# Patient Record
Sex: Male | Born: 1955
Health system: Southern US, Community
[De-identification: ages and names within clinical notes are randomized; demographics above are authoritative.]

## PROBLEM LIST (undated history)

## (undated) DIAGNOSIS — G4730 Sleep apnea, unspecified: Secondary | ICD-10-CM

## (undated) DIAGNOSIS — I519 Heart disease, unspecified: Secondary | ICD-10-CM

## (undated) DIAGNOSIS — E119 Type 2 diabetes mellitus without complications: Secondary | ICD-10-CM

## (undated) HISTORY — DX: Heart disease, unspecified: I51.9

## (undated) HISTORY — PX: HERNIA REPAIR: SHX51

## (undated) HISTORY — PX: APPENDECTOMY: SHX54

## (undated) HISTORY — DX: Type 2 diabetes mellitus without complications: E11.9

## (undated) HISTORY — DX: Sleep apnea, unspecified: G47.30

## (undated) HISTORY — PX: PACEMAKER IMPLANT: EP1218

---

## 2020-02-18 ENCOUNTER — Ambulatory Visit (INDEPENDENT_AMBULATORY_CARE_PROVIDER_SITE_OTHER): Payer: Medicare Other | Admitting: Cardiology

## 2020-02-18 ENCOUNTER — Other Ambulatory Visit: Payer: Self-pay

## 2020-02-18 VITALS — BP 130/64 | HR 70 | Temp 98.2°F | Ht 70.0 in | Wt 305.0 lb

## 2020-02-18 DIAGNOSIS — I4891 Unspecified atrial fibrillation: Secondary | ICD-10-CM

## 2020-02-18 DIAGNOSIS — Z950 Presence of cardiac pacemaker: Secondary | ICD-10-CM | POA: Diagnosis not present

## 2020-02-18 DIAGNOSIS — E782 Mixed hyperlipidemia: Secondary | ICD-10-CM | POA: Diagnosis not present

## 2020-02-18 DIAGNOSIS — I10 Essential (primary) hypertension: Secondary | ICD-10-CM | POA: Diagnosis not present

## 2020-02-18 MED ORDER — XARELTO 20 MG PO TABS: 20.0000 mg | ORAL_TABLET | Freq: Every day | ORAL | 3 refills | Status: DC

## 2020-02-18 NOTE — Progress Notes (Signed)
Clinical Summary Jeffery Wright is a 64 y.o.male seen today as a new consult, referred by PA Mann for the following medical problems.    1. Pacemaker/Heart block - previously follwoed by Dr Pete Glatter [phone]) - Medtronic [id number] implanted May 2016. Advisa DR MRI sure scan pacemaker.  - last check about 2 months ago  - no recent symptoms   2. HTN - compliant with meds   3. Afib - reports episode of afib on device  - has been on xarelto.  - no recent palpitaions     Had 2nd covid vaccine 2 weeks ago from health dept.     PMH HTN DM2 HL Pacemaker   No Known Allergies   Current Outpatient Medications  Medication Sig Dispense Refill  . amLODipine (NORVASC) 10 MG tablet Take 10 mg by mouth daily.    Marland Kitchen atorvastatin (LIPITOR) 40 MG tablet     . finasteride (PROSCAR) 5 MG tablet Take 5 mg by mouth daily.    . Insulin Degludec (TRESIBA FLEXTOUCH [place]) Inject into the skin.    Marland Kitchen insulin lispro (HUMALOG) 100 UNIT/ML injection Inject into the skin 3 (three) times daily before meals.    Marland Kitchen JARDIANCE 25 MG TABS tablet Take 25 mg by mouth daily.    Marland Kitchen levothyroxine (SYNTHROID) 125 MCG tablet     . oxybutynin (DITROPAN) 5 MG tablet Take 5 mg by mouth 3 (three) times daily.    Marland Kitchen spironolactone (ALDACTONE) 25 MG tablet Take 25 mg by mouth daily.    Carlena Hurl 20 MG TABS tablet Take 20 mg by mouth daily.     No current facility-administered medications for this visit.        No Known Allergies    No family history on file.   Social History Jeffery Wright has no history on file for tobacco. Jeffery Wright has no history on file for alcohol.   Review of Systems CONSTITUTIONAL: No weight loss, fever, chills, weakness or fatigue.  HEENT: Eyes: No visual loss, blurred vision, double vision or yellow sclerae.No hearing loss, sneezing, congestion, runny nose or sore throat.  SKIN: No rash or itching.  CARDIOVASCULAR: per hpi RESPIRATORY: No shortness of  breath, cough or sputum.  GASTROINTESTINAL: No anorexia, nausea, vomiting or diarrhea. No abdominal pain or blood.  GENITOURINARY: No burning on urination, no polyuria NEUROLOGICAL: No headache, dizziness, syncope, paralysis, ataxia, numbness or tingling in the extremities. No change in bowel or bladder control.  MUSCULOSKELETAL: No muscle, back pain, joint pain or stiffness.  LYMPHATICS: No enlarged nodes. No history of splenectomy.  PSYCHIATRIC: No history of depression or anxiety.  ENDOCRINOLOGIC: No reports of sweating, cold or heat intolerance. No polyuria or polydipsia.  Marland Kitchen   Physical Examination Vitals:   02/18/20 0852  BP: 130/64  Pulse: 70  Temp: 98.2 F (36.8 C)  SpO2: 95%   Filed Weights   02/18/20 0852  Weight: (!) 305 lb (138.3 kg)    Gen: resting comfortably, no acute distress HEENT: no scleral icterus, pupils equal round and reactive, no palptable cervical adenopathy,  CV: RRR, 2/6 systolic murmur rusb, no jvd Resp: Clear to auscultation bilaterally GI: abdomen is soft, non-tender, non-distended, normal bowel sounds, no hepatosplenomegaly MSK: extremities are warm, no edema.  Skin: warm, no rash Neuro:  no focal deficits Psych: appropriate affect      Assessment and Plan  1. Pacemaker/Heart block - new to [place], pacemaker placed in [place] medtronic device - will have him establish  in device clinic - request records from prior cardiologist  2. HTN - at goal, continue current meds  3. Afib - no symptoms, continue xarelto - has not been on av nodal agent, f/u device check  4. Hyperlipidemia - continue statin, request pcp labs      Arnoldo Lenis, M.D.

## 2020-02-18 NOTE — Patient Instructions (Signed)
Medication Instructions:  Your physician recommends that you continue on your current medications as directed. Please refer to the Current Medication list given to you today.  *If you need a refill on your cardiac medications before your next appointment, please call your pharmacy*   Lab Work: None today If you have labs (blood work) drawn today and your tests are completely normal, you will receive your results only by: Marland Kitchen MyChart Message (if you have MyChart) OR . A paper copy in the mail If you have any lab test that is abnormal or we need to change your treatment, we will call you to review the results.   Testing/Procedures: None today   Follow-Up: At [facility], you and your health needs are our priority.  As part of our continuing mission to provide you with exceptional heart care, we have created designated Provider Care Teams.  These Care Teams include your primary Cardiologist (physician) and Advanced Practice Providers (APPs -  Physician Assistants and Nurse Practitioners) who all work together to provide you with the care you need, when you need it.  We recommend signing up for the patient portal called "MyChart".  Sign up information is provided on this After Visit Summary.  MyChart is used to connect with patients for Virtual Visits (Telemedicine).  Patients are able to view lab/test results, encounter notes, upcoming appointments, etc.  Non-urgent messages can be sent to your provider as well.   To learn more about what you can do with MyChart, go to [email or web address].   We  Your next appointment:   6 month(s)  The format for your next appointment:   In Person  Provider:   Dina Rich, MD   Other Instructions We will make an apt for you with Device Clinic for your Medtronic pacemaker.       Thank you for choosing [facility] !

## 2020-03-16 ENCOUNTER — Encounter: Payer: Medicare Other | Admitting: Internal Medicine

## 2020-03-30 ENCOUNTER — Ambulatory Visit (INDEPENDENT_AMBULATORY_CARE_PROVIDER_SITE_OTHER): Payer: Medicare Other | Admitting: Urology

## 2020-03-30 ENCOUNTER — Encounter: Payer: Self-pay | Admitting: Urology

## 2020-03-30 ENCOUNTER — Other Ambulatory Visit: Payer: Self-pay

## 2020-03-30 VITALS — BP 123/75 | HR 67 | Temp 98.1°F | Ht 70.0 in | Wt 308.0 lb

## 2020-03-30 DIAGNOSIS — R972 Elevated prostate specific antigen [PSA]: Secondary | ICD-10-CM

## 2020-03-30 DIAGNOSIS — N3281 Overactive bladder: Secondary | ICD-10-CM

## 2020-03-30 LAB — POCT URINALYSIS DIPSTICK
Bilirubin, UA: NEGATIVE
Blood, UA: NEGATIVE
Glucose, UA: POSITIVE — AB
Ketones, UA: NEGATIVE
Leukocytes, UA: NEGATIVE
Nitrite, UA: NEGATIVE
Protein, UA: NEGATIVE
Spec Grav, UA: 1.025 (ref 1.010–1.025)
Urobilinogen, UA: NEGATIVE E.U./dL — AB
pH, UA: 5 (ref 5.0–8.0)

## 2020-03-30 LAB — BLADDER SCAN AMB NON-IMAGING: Scan Result: 130.1

## 2020-03-30 MED ORDER — MIRABEGRON ER 25 MG PO TB24
25.0000 mg | ORAL_TABLET | Freq: Every day | ORAL | 0 refills | Status: DC
Start: 2020-03-30 — End: 2020-04-26

## 2020-03-30 MED ORDER — FINASTERIDE 5 MG PO TABS: 5.0000 mg | ORAL_TABLET | Freq: Every day | ORAL | 3 refills | Status: DC

## 2020-03-30 NOTE — Patient Instructions (Signed)

## 2020-03-30 NOTE — Progress Notes (Signed)
03/30/2020 11:59 AM   Jeffery Wright 10-24-1956 [id number]  Referring provider: Assunta Found, MD [address] [place],  [place] [address]  No chief complaint on file.   HPI: Mr Jeffery Wright is a 63yo here for evaluation of BPH and OAB. He is on finasteride currently for BPH. He has been on finasteride for over 3 years. [place]. Nocturia 3x up form 0.5x. He biggest complaint is urinary urgency and urge incontinence. He is currently on oxybutynin TID. He was previously on mirabegron which worked well for his OAB symptoms.  PSA 1 year ago was <0.01.    PMH: Past Medical History:  Diagnosis Date  . Diabetes mellitus without complication (HCC)   . Heart disease   . Sleep apnea     Surgical History: Past Surgical History:  Procedure Laterality Date  . APPENDECTOMY    . HERNIA REPAIR    . PACEMAKER IMPLANT      Home Medications:  Allergies as of 03/30/2020   No Known Allergies     Medication List       Accurate as of March 30, 2020 11:59 AM. If you have any questions, ask your nurse or doctor.        amLODipine 10 MG tablet Commonly known as: NORVASC Take 10 mg by mouth daily.   atorvastatin 40 MG tablet Commonly known as: LIPITOR   finasteride 5 MG tablet Commonly known as: PROSCAR Take 5 mg by mouth daily.   insulin lispro 100 UNIT/ML injection Commonly known as: HUMALOG Inject into the skin 3 (three) times daily before meals.   Jardiance 25 MG Tabs tablet Generic drug: empagliflozin Take 25 mg by mouth daily.   levothyroxine 125 MCG tablet Commonly known as: SYNTHROID   oxybutynin 5 MG tablet Commonly known as: DITROPAN Take 5 mg by mouth 3 (three) times daily.   spironolactone 25 MG tablet Commonly known as: ALDACTONE Take 25 mg by mouth daily.   Jeffery Wright Jeffery Wright Inject into the skin.   Jeffery Wright 100 UNIT/ML Wright Pen Generic drug: insulin degludec   Jeffery Wright 200 UNIT/ML [place] Pen Generic drug:  insulin degludec SMARTSIG:180 Unit(s) Topical Daily   Xarelto 20 MG Tabs tablet Generic drug: rivaroxaban Take 1 tablet (20 mg total) by mouth daily.       Allergies: No Known Allergies  Family History: Family History  Problem Relation Age of Onset  . Leukemia Father   . Stroke Mother     Social History:  reports that he has never smoked. He has never used smokeless tobacco. No history on file for alcohol and drug.  ROS: All other review of systems were reviewed and are negative except what is noted above in HPI  Physical Exam: BP 123/75   Pulse 67   Temp 98.1 F (36.7 C)   Ht 5\' 10"  (1.778 m)   Wt (!) 308 lb (139.7 kg)   BMI 44.19 kg/m   Constitutional:  Alert and oriented, No acute distress. HEENT: Jeffery Wright AT, moist mucus membranes.  Trachea midline, no masses. Cardiovascular: No clubbing, cyanosis, or edema. Respiratory: Normal respiratory effort, no increased work of breathing. GI: Abdomen is soft, nontender, nondistended, no abdominal masses GU: No CVA tenderness. Circumcised phallus. No masses/lesions on penis, testis, scrotum. Prostate 40g smooth no nodules no induration.  Lymph: No cervical or inguinal lymphadenopathy. Skin: No rashes, bruises or suspicious lesions. Neurologic: Grossly intact, no focal deficits, moving all 4 extremities. Psychiatric: Normal mood and affect.  Laboratory Data: No results  Wright for: WBC, HGB, HCT, MCV, PLT  No results Wright for: CREATININE  No results Wright for: PSA  No results Wright for: TESTOSTERONE  No results Wright for: HGBA1C  Urinalysis    Component Value Date/Time   BILIRUBINUR neg 03/30/2020 1151   PROTEINUR Negative 03/30/2020 1151   UROBILINOGEN negative (A) 03/30/2020 1151   NITRITE neg 03/30/2020 1151   LEUKOCYTESUR Negative 03/30/2020 1151    No results Wright for: LABMICR, [place], RBCUA, LABEPIT, MUCUS, BACTERIA  Pertinent Imaging:  No results Wright for this or any previous visit. No results Wright for  this or any previous visit. No results Wright for this or any previous visit. No results Wright for this or any previous visit. No results Wright for this or any previous visit. No results Wright for this or any previous visit. No results Wright for this or any previous visit. No results Wright for this or any previous visit.  Assessment & Plan:    1. BPH with nocturia -Continue finasterdie - POCT urinalysis dipstick - BLADDER SCAN AMB NON-IMAGING  2. OAB (overactive bladder) -mirabegron 25mg , stop oxybutynin -RTC 4-6 weeks    No follow-ups on file.  Jeffery Bang, MD  [facility] [place]

## 2020-03-30 NOTE — Progress Notes (Signed)
Urological Symptom Review  Patient is experiencing the following symptoms: Frequent urination Hard to postpone urination Get up at night to urinate Leakage of urine Erection problems (male only)   Review of Systems  Gastrointestinal (upper)  : Negative for upper GI symptoms  Gastrointestinal (lower) : Negative for lower GI symptoms  Constitutional : Negative for symptoms  Skin: Negative for skin symptoms  Eyes: Negative for eye symptoms  Ear/Nose/Throat : Negative for Ear/Nose/Throat symptoms  Hematologic/Lymphatic: Negative for Hematologic/Lymphatic symptoms  Cardiovascular : Negative for cardiovascular symptoms  Respiratory : Negative for respiratory symptoms  Endocrine: Excessive thirst  Musculoskeletal: Negative for musculoskeletal symptoms  Neurological: Negative for neurological symptoms  Psychologic: Negative for psychiatric symptoms

## 2020-04-23 ENCOUNTER — Other Ambulatory Visit: Payer: Self-pay | Admitting: Urology

## 2020-05-19 ENCOUNTER — Other Ambulatory Visit: Payer: Self-pay | Admitting: Urology

## 2020-06-16 ENCOUNTER — Other Ambulatory Visit: Payer: Self-pay | Admitting: Urology

## 2020-07-02 ENCOUNTER — Other Ambulatory Visit: Payer: Self-pay | Admitting: Urology

## 2020-07-19 ENCOUNTER — Other Ambulatory Visit: Payer: Self-pay | Admitting: Urology

## 2020-08-17 ENCOUNTER — Other Ambulatory Visit: Payer: Medicare Other

## 2020-08-18 ENCOUNTER — Ambulatory Visit: Payer: Medicare Other | Admitting: Cardiology

## 2020-08-23 ENCOUNTER — Telehealth: Payer: Self-pay

## 2020-08-23 MED ORDER — AMLODIPINE BESYLATE 10 MG PO TABS: 10.0000 mg | ORAL_TABLET | Freq: Every day | ORAL | 1 refills | Status: DC

## 2020-08-23 NOTE — Telephone Encounter (Signed)
Refilled as requested  

## 2020-08-23 NOTE — Telephone Encounter (Signed)
New message    *STAT* If patient is at the pharmacy, call can be transferred to refill team.   1. Which medications need to be refilled? (please list name of each medication and dose if known) amlodipine  2. Which pharmacy/location (including street and city if local pharmacy) is medication to be sent to? [facility]  3. Do they need a 30 day or 90 day supply? 90

## 2020-08-31 ENCOUNTER — Ambulatory Visit: Payer: Medicare Other | Admitting: Urology

## 2020-09-21 ENCOUNTER — Telehealth: Payer: Self-pay

## 2020-09-21 ENCOUNTER — Ambulatory Visit (INDEPENDENT_AMBULATORY_CARE_PROVIDER_SITE_OTHER): Payer: Medicare Other | Admitting: Internal Medicine

## 2020-09-21 ENCOUNTER — Encounter: Payer: Self-pay | Admitting: Internal Medicine

## 2020-09-21 ENCOUNTER — Other Ambulatory Visit: Payer: Self-pay

## 2020-09-21 VITALS — BP 132/70 | HR 70 | Ht 70.0 in | Wt 301.0 lb

## 2020-09-21 DIAGNOSIS — I442 Atrioventricular block, complete: Secondary | ICD-10-CM | POA: Diagnosis not present

## 2020-09-21 LAB — CUP PACEART INCLINIC DEVICE CHECK
Battery Remaining Longevity: 40 mo
Battery Voltage: 2.97 V
Brady Statistic AP VP Percent: 30.57 %
Brady Statistic AP VS Percent: 0 %
Brady Statistic AS VP Percent: 69.42 %
Brady Statistic AS VS Percent: 0.01 %
Brady Statistic RA Percent Paced: 30.56 %
Brady Statistic RV Percent Paced: 99.98 %
Date Time Interrogation Session: 20211020143247
Implantable Lead Implant Date: 20160525
Implantable Lead Implant Date: 20160525
Implantable Lead Location: 753859
Implantable Lead Location: 753860
Implantable Lead Model: 5076
Implantable Lead Model: 5076
Implantable Pulse Generator Implant Date: 20160525
Lead Channel Impedance Value: 399 Ohm
Lead Channel Impedance Value: 418 Ohm
Lead Channel Impedance Value: 456 Ohm
Lead Channel Impedance Value: 494 Ohm
Lead Channel Pacing Threshold Amplitude: 0.5 V
Lead Channel Pacing Threshold Amplitude: 0.75 V
Lead Channel Pacing Threshold Pulse Width: 0.4 ms
Lead Channel Pacing Threshold Pulse Width: 0.4 ms
Lead Channel Sensing Intrinsic Amplitude: 12.5 mV
Lead Channel Sensing Intrinsic Amplitude: 2.1 mV
Lead Channel Setting Pacing Amplitude: 2 V
Lead Channel Setting Pacing Amplitude: 2 V
Lead Channel Setting Pacing Pulse Width: 0.4 ms
Lead Channel Setting Sensing Sensitivity: 1.2 mV

## 2020-09-21 NOTE — Progress Notes (Signed)
HPI Jeffery Wright presents today for ongoing evaluation andmanagement of his medtronic PPM. He is a pleasant 64 yo man with CHB, s/p PPM insertion, DM, morbid obesity, and HTN. He denies chest pressure or sob. No edema. He has been compliant with his meds.  No Known Allergies   Current Outpatient Medications  Medication Sig Dispense Refill  . amLODipine (NORVASC) 10 MG tablet Take 1 tablet (10 mg total) by mouth daily. 90 tablet 1  . atorvastatin (LIPITOR) 40 MG tablet     . finasteride (PROSCAR) 5 MG tablet Take 1 tablet (5 mg total) by mouth daily. 90 tablet 3  . Insulin Degludec (TRESIBA FLEXTOUCH [place]) Inject into the skin.    Marland Kitchen insulin lispro (HUMALOG) 100 UNIT/ML injection Inject into the skin 3 (three) times daily before meals.    Marland Kitchen JARDIANCE 25 MG TABS tablet Take 25 mg by mouth daily.    Marland Kitchen levothyroxine (SYNTHROID) 125 MCG tablet     . MYRBETRIQ 25 MG TB24 tablet TAKE 1 TABLET DAILY 30 tablet 0  . oxybutynin (DITROPAN) 5 MG tablet Take 5 mg by mouth 3 (three) times daily.    Marland Kitchen spironolactone (ALDACTONE) 25 MG tablet Take 25 mg by mouth daily.    . TRESIBA FLEXTOUCH 200 UNIT/ML FlexTouch Pen SMARTSIG:180 Unit(s) Topical Daily    . XARELTO 20 MG TABS tablet Take 1 tablet (20 mg total) by mouth daily. 90 tablet 3  . TRESIBA FLEXTOUCH 100 UNIT/ML FlexTouch Pen      No current facility-administered medications for this visit.     Past Medical History:  Diagnosis Date  . Diabetes mellitus without complication (HCC)   . Heart disease   . Sleep apnea     ROS:   All systems reviewed and negative except as noted in the HPI.   Past Surgical History:  Procedure Laterality Date  . APPENDECTOMY    . HERNIA REPAIR    . PACEMAKER IMPLANT       Family History  Problem Relation Age of Onset  . Leukemia Father   . Stroke Mother      Social History   Socioeconomic History  . Marital status: Married    Spouse name: Not on file  . Number of children: 2  . Years of  education: Not on file  . Highest education level: Not on file  Occupational History  . Occupation: Research scientist (medical)  Tobacco Use  . Smoking status: Never Smoker  . Smokeless tobacco: Never Used  Substance and Sexual Activity  . Alcohol use: Not on file  . Drug use: Not on file  . Sexual activity: Not on file  Other Topics Concern  . Not on file  Social History Narrative  . Not on file   Social Determinants of Health   Financial Resource Strain:   . Difficulty of Paying Living Expenses: Not on file  Food Insecurity:   . Worried About Programme researcher, broadcasting/film/video in the Last Year: Not on file  . Ran Out of Food in the Last Year: Not on file  Transportation Needs:   . Lack of Transportation (Medical): Not on file  . Lack of Transportation (Non-Medical): Not on file  Physical Activity:   . Days of Exercise per Week: Not on file  . Minutes of Exercise per Session: Not on file  Stress:   . Feeling of Stress : Not on file  Social Connections:   . Frequency of Communication with Friends and Family: Not  on file  . Frequency of Social Gatherings with Friends and Family: Not on file  . Attends Religious Services: Not on file  . Active Member of Clubs or Organizations: Not on file  . Attends Banker Meetings: Not on file  . Marital Status: Not on file  Intimate Partner Violence:   . Fear of Current or Ex-Partner: Not on file  . Emotionally Abused: Not on file  . Physically Abused: Not on file  . Sexually Abused: Not on file     BP 132/70   Pulse 70   Ht 5\' 10"  (1.778 m)   Wt (!) 301 lb (136.5 kg)   SpO2 97%   BMI 43.19 kg/m   Physical Exam:  Well appearing but overweight,NAD HEENT: Unremarkable Neck:  No JVD, no thyromegally Lymphatics:  No adenopathy Back:  No CVA tenderness Lungs:  Clear with no wheezes HEART:  Regular rate rhythm, no murmurs, no rubs, no clicks Abd:  soft, positive bowel sounds, no organomegally, no rebound, no guarding Ext:  2 plus pulses, no  edema, no cyanosis, no clubbing Skin:  No rashes no nodules Neuro:  CN II through XII intact, motor grossly intact  DEVICE  Normal device function.  See PaceArt for details.   Assess/Plan: 1. CHB - he is asymptomatic, s/p PPM insertion. 2. PPM - he has about 3 years on his PPM battery. 3. HTN - his bp is well controlled.  4. PAF - he is maintaining NSR. He will continue xarelto.  Jarrod Bodkins,MD

## 2020-09-21 NOTE — Patient Instructions (Signed)
Medication Instructions:  Your physician recommends that you continue on your current medications as directed. Please refer to the Current Medication list given to you today.  *If you need a refill on your cardiac medications before your next appointment, please call your pharmacy*   Lab Work: NONE   If you have labs (blood work) drawn today and your tests are completely normal, you will receive your results only by: . MyChart Message (if you have MyChart) OR . A paper copy in the mail If you have any lab test that is abnormal or we need to change your treatment, we will call you to review the results.   Testing/Procedures: NONE    Follow-Up: At [facility], you and your health needs are our priority.  As part of our continuing mission to provide you with exceptional heart care, we have created designated Provider Care Teams.  These Care Teams include your primary Cardiologist (physician) and Advanced Practice Providers (APPs -  Physician Assistants and Nurse Practitioners) who all work together to provide you with the care you need, when you need it.  We recommend signing up for the patient portal called "MyChart".  Sign up information is provided on this After Visit Summary.  MyChart is used to connect with patients for Virtual Visits (Telemedicine).  Patients are able to view lab/test results, encounter notes, upcoming appointments, etc.  Non-urgent messages can be sent to your provider as well.   To learn more about what you can do with MyChart, go to [email or web address].    Your next appointment:   1 year(s)  The format for your next appointment:   In Person  Provider:   Gregg Taylor, MD   Other Instructions Thank you for choosing [facility]!    

## 2020-09-21 NOTE — Telephone Encounter (Signed)
Patient is a new patient of Dr. Lubertha Basque. Patient is agreeable to transfer monitoring here. Attempted to call office [phone]. No answer x2. Will need patient released from clinic in [place].   Patient has been requested in [place].  Patient has remote monitor box already.

## 2020-09-26 ENCOUNTER — Ambulatory Visit (INDEPENDENT_AMBULATORY_CARE_PROVIDER_SITE_OTHER): Payer: Medicare Other | Admitting: Urology

## 2020-09-26 ENCOUNTER — Encounter: Payer: Self-pay | Admitting: Urology

## 2020-09-26 ENCOUNTER — Other Ambulatory Visit: Payer: Self-pay

## 2020-09-26 VITALS — BP 133/70 | HR 79 | Temp 98.1°F | Ht 70.0 in | Wt 301.0 lb

## 2020-09-26 DIAGNOSIS — N3281 Overactive bladder: Secondary | ICD-10-CM

## 2020-09-26 DIAGNOSIS — N401 Enlarged prostate with lower urinary tract symptoms: Secondary | ICD-10-CM

## 2020-09-26 DIAGNOSIS — N138 Other obstructive and reflux uropathy: Secondary | ICD-10-CM | POA: Insufficient documentation

## 2020-09-26 DIAGNOSIS — R351 Nocturia: Secondary | ICD-10-CM | POA: Diagnosis not present

## 2020-09-26 LAB — URINALYSIS, ROUTINE W REFLEX MICROSCOPIC
Bilirubin, UA: NEGATIVE
Ketones, UA: NEGATIVE
Leukocytes,UA: NEGATIVE
Nitrite, UA: NEGATIVE
Protein,UA: NEGATIVE
RBC, UA: NEGATIVE
Specific Gravity, UA: 1.02 (ref 1.005–1.030)
Urobilinogen, Ur: 0.2 mg/dL (ref 0.2–1.0)
pH, UA: 5 (ref 5.0–7.5)

## 2020-09-26 LAB — BLADDER SCAN AMB NON-IMAGING: Scan Result: 29

## 2020-09-26 MED ORDER — FINASTERIDE 5 MG PO TABS
5.0000 mg | ORAL_TABLET | Freq: Every day | ORAL | 3 refills | Status: DC
Start: 2020-09-26 — End: 2021-11-10

## 2020-09-26 MED ORDER — AMBULATORY NON FORMULARY MEDICATION: 0.2000 mL | 5 refills | Status: DC | PRN

## 2020-09-26 NOTE — Progress Notes (Signed)
PVR=29    Urological Symptom Review  Patient is experiencing the following symptoms: Frequent urination Hard to postpone urination Get up at night to urinate Leakage of urine Erection problems (male only)   Review of Systems  Gastrointestinal (upper)  : Negative for upper GI symptoms  Gastrointestinal (lower) : Negative for lower GI symptoms  Constitutional : Negative for symptoms  Skin: Negative for skin symptoms  Eyes: Negative for eye symptoms  Ear/Nose/Throat : Negative for Ear/Nose/Throat symptoms  Hematologic/Lymphatic: Negative for Hematologic/Lymphatic symptoms  Cardiovascular : Negative for cardiovascular symptoms  Respiratory : Negative for respiratory symptoms  Endocrine: Negative for endocrine symptoms  Musculoskeletal: Negative for musculoskeletal symptoms  Neurological: Negative for neurological symptoms  Psychologic: Negative for psychiatric symptoms

## 2020-09-26 NOTE — Progress Notes (Signed)
09/26/2020 9:56 AM   Jeffery Wright 1956/04/06 [id number]  Referring provider: Shawnie Dapper, PA-C [address] [place],  [place] [address]  Followup OAB, nocturia  HPI: Jeffery Wright is a 64yo here for followup for OAB and BPH. He has a strong stream. Nocturia 1-3x. He has continued urgency, frequency and urge incontinence. He is currently on mirabegron 25mg  daily. He is on jardiance.  He has worsening ED and has failed cilais and viagra without success. He tried edex injections which did not improve his ED.    PMH: Past Medical History:  Diagnosis Date  . Diabetes mellitus without complication (HCC)   . Heart disease   . Sleep apnea     Surgical History: Past Surgical History:  Procedure Laterality Date  . APPENDECTOMY    . HERNIA REPAIR    . PACEMAKER IMPLANT      Home Medications:  Allergies as of 09/26/2020   No Known Allergies     Medication List       Accurate as of September 26, 2020  9:56 AM. If you have any questions, ask your nurse or doctor.        amLODipine 10 MG tablet Commonly known as: NORVASC Take 1 tablet (10 mg total) by mouth daily.   atorvastatin 40 MG tablet Commonly known as: LIPITOR   finasteride 5 MG tablet Commonly known as: PROSCAR Take 1 tablet (5 mg total) by mouth daily.   insulin lispro 100 UNIT/ML injection Commonly known as: HUMALOG Inject into the skin 3 (three) times daily before meals.   Jardiance 25 MG Tabs tablet Generic drug: empagliflozin Take 25 mg by mouth daily.   levothyroxine 125 MCG tablet Commonly known as: SYNTHROID   levothyroxine 137 MCG tablet Commonly known as: SYNTHROID Take 137 mcg by mouth daily.   Myrbetriq 25 MG Tb24 tablet Generic drug: mirabegron ER TAKE 1 TABLET DAILY   oxybutynin 5 MG tablet Commonly known as: DITROPAN Take 5 mg by mouth 3 (three) times daily.   spironolactone 25 MG tablet Commonly known as: ALDACTONE Take 25 mg by mouth daily.   TRESIBA  FLEXTOUCH [place] Inject into the skin. What changed: Another medication with the same name was removed. Continue taking this medication, and follow the directions you see here. Changed by: September 28, 2020, MD   Wilkie Aye FlexTouch 200 UNIT/ML FlexTouch Pen Generic drug: insulin degludec SMARTSIG:180 Unit(s) Topical Daily What changed: Another medication with the same name was removed. Continue taking this medication, and follow the directions you see here. Changed by: Evaristo Bury, MD   Xarelto 20 MG Tabs tablet Generic drug: rivaroxaban Take 1 tablet (20 mg total) by mouth daily.       Allergies: No Known Allergies  Family History: Family History  Problem Relation Age of Onset  . Leukemia Father   . Stroke Mother     Social History:  reports that he has never smoked. He has never used smokeless tobacco. No history on file for alcohol use and drug use.  ROS: All other review of systems were reviewed and are negative except what is noted above in HPI  Physical Exam: BP 133/70   Pulse 79   Temp 98.1 F (36.7 C)   Ht 5\' 10"  (1.778 m)   Wt (!) 301 lb (136.5 kg)   BMI 43.19 kg/m   Constitutional:  Alert and oriented, No acute distress. HEENT: [place] AT, moist mucus membranes.  Trachea midline, no masses. Cardiovascular: No clubbing, cyanosis, or  edema. Respiratory: Normal respiratory effort, no increased work of breathing. GI: Abdomen is soft, nontender, nondistended, no abdominal masses GU: No CVA tenderness.  Lymph: No cervical or inguinal lymphadenopathy. Skin: No rashes, bruises or suspicious lesions. Neurologic: Grossly intact, no focal deficits, moving all 4 extremities. Psychiatric: Normal mood and affect.  Laboratory Data: No results found for: WBC, HGB, HCT, MCV, PLT  No results found for: CREATININE  No results found for: PSA  No results found for: TESTOSTERONE  No results found for: HGBA1C  Urinalysis    Component Value Date/Time   BILIRUBINUR neg  03/30/2020 1151   PROTEINUR Negative 03/30/2020 1151   UROBILINOGEN negative (A) 03/30/2020 1151   NITRITE neg 03/30/2020 1151   LEUKOCYTESUR Negative 03/30/2020 1151    No results found for: LABMICR, WBCUA, RBCUA, LABEPIT, MUCUS, BACTERIA  Pertinent Imaging:  No results found for this or any previous visit.  No results found for this or any previous visit.  No results found for this or any previous visit.  No results found for this or any previous visit.  No results found for this or any previous visit.  No results found for this or any previous visit.  No results found for this or any previous visit.  No results found for this or any previous visit.   Assessment & Plan:    1. OAB (overactive bladder) -Continue mirabegron - BLADDER SCAN AMB NON-IMAGING - Urinalysis, Routine w reflex microscopic  2. BPh with Nocturia -Continue finasteride -   No follow-ups on file.  Wilkie Aye, MD  [facility] [place]

## 2020-09-26 NOTE — Patient Instructions (Signed)

## 2020-09-28 NOTE — Telephone Encounter (Signed)
Spoke to [place] practice and patient was released to this practice in [place]. 91 day transmissions scheduled in Carelink and Epic. Patient contacted and aware of scheduled remotes evrey 91 days. He will send manual transmission as scheduled.

## 2020-10-04 ENCOUNTER — Other Ambulatory Visit: Payer: Self-pay

## 2020-10-04 ENCOUNTER — Encounter: Payer: Self-pay | Admitting: Cardiology

## 2020-10-04 ENCOUNTER — Ambulatory Visit (INDEPENDENT_AMBULATORY_CARE_PROVIDER_SITE_OTHER): Payer: Medicare Other | Admitting: Cardiology

## 2020-10-04 VITALS — BP 150/90 | HR 75 | Ht 70.0 in | Wt 305.8 lb

## 2020-10-04 DIAGNOSIS — I4891 Unspecified atrial fibrillation: Secondary | ICD-10-CM | POA: Diagnosis not present

## 2020-10-04 DIAGNOSIS — Z950 Presence of cardiac pacemaker: Secondary | ICD-10-CM | POA: Diagnosis not present

## 2020-10-04 DIAGNOSIS — I10 Essential (primary) hypertension: Secondary | ICD-10-CM | POA: Diagnosis not present

## 2020-10-04 MED ORDER — AMLODIPINE BESY-BENAZEPRIL HCL 5-40 MG PO CAPS: 1.0000 | ORAL_CAPSULE | Freq: Every day | ORAL | 0 refills | Status: DC

## 2020-10-04 NOTE — Patient Instructions (Signed)
Medication Instructions:  STOP Amlodipine   START Lotrel 5-40 mg daily  *If you need a refill on your cardiac medications before your next appointment, please call your pharmacy*   Lab Work: None today If you have labs (blood work) drawn today and your tests are completely normal, you will receive your results only by: Marland Kitchen MyChart Message (if you have MyChart) OR . A paper copy in the mail If you have any lab test that is abnormal or we need to change your treatment, we will call you to review the results.   Testing/Procedures: None today   Follow-Up: At [facility], you and your health needs are our priority.  As part of our continuing mission to provide you with exceptional heart care, we have created designated Provider Care Teams.  These Care Teams include your primary Cardiologist (physician) and Advanced Practice Providers (APPs -  Physician Assistants and Nurse Practitioners) who all work together to provide you with the care you need, when you need it.  We recommend signing up for the patient portal called "MyChart".  Sign up information is provided on this After Visit Summary.  MyChart is used to connect with patients for Virtual Visits (Telemedicine).  Patients are able to view lab/test results, encounter notes, upcoming appointments, etc.  Non-urgent messages can be sent to your provider as well.   To learn more about what you can do with MyChart, go to [email or web address].    Your next appointment:   6 month(s)  The format for your next appointment:   In Person  Provider:   Dina Rich, MD   Other Instructions None      Thank you for choosing [facility] !

## 2020-10-04 NOTE — Progress Notes (Signed)
Clinical Summary Jeffery Wright is a 64 y.o.male seen today for follow up of the following medical problems.     1. Pacemaker/Heart block - previously follwoed by Dr Pete Glatter [phone]) - Medtronic [id number] implanted May 2016. Advisa DR MRI sure scan pacemaker.   - no symptoms, recent visit with EP.   2. HTN - compliant with meds  - recent swelling in legs on norvasc 10mg  - reports he had been on lotrel 5/40 prior to moving to [place] but was changed here. Has been on norvasc 10mg , since on he has had some LE edema   3. Afib  - no recent palpitations - no bleeding on xarelto.      Has had moderna covid vaccine x 2   PMH HTN DM2 HL Pacemaker    Past Medical History:  Diagnosis Date  . Diabetes mellitus without complication (HCC)   . Heart disease   . Sleep apnea      No Known Allergies   Current Outpatient Medications  Medication Sig Dispense Refill  . AMBULATORY NON FORMULARY MEDICATION 0.2 mLs by Intracavernosal route as needed. Medication Name: Trimix  PGE 6/40 Pap 30mg  Phent 1mg  5 mL 5  . amLODipine (NORVASC) 10 MG tablet Take 1 tablet (10 mg total) by mouth daily. 90 tablet 1  . atorvastatin (LIPITOR) 40 MG tablet     . finasteride (PROSCAR) 5 MG tablet Take 1 tablet (5 mg total) by mouth daily. 90 tablet 3  . Insulin Degludec (TRESIBA FLEXTOUCH [place]) Inject into the skin.    insulin lispro (HUMALOG) 100 UNIT/ML injection Inject into the skin 3 (three) times daily before meals.    JARDIANCE 25 MG TABS tablet Take 25 mg by mouth daily.    levothyroxine (SYNTHROID) 125 MCG tablet     . levothyroxine (SYNTHROID) 137 MCG tablet Take 137 mcg by mouth daily.    MYRBETRIQ 25 MG TB24 tablet TAKE 1 TABLET DAILY 30 tablet 0  . oxybutynin (DITROPAN) 5 MG tablet Take 5 mg by mouth 3 (three) times daily.    Marland Kitchen spironolactone (ALDACTONE) 25 MG tablet Take 25 mg by mouth daily.    . TRESIBA FLEXTOUCH 200 UNIT/ML FlexTouch Pen SMARTSIG:180  Unit(s) Topical Daily    . XARELTO 20 MG TABS tablet Take 1 tablet (20 mg total) by mouth daily. 90 tablet 3   No current facility-administered medications for this visit.     Past Surgical History:  Procedure Laterality Date  . APPENDECTOMY    . HERNIA REPAIR    . PACEMAKER IMPLANT       No Known Allergies    Family History  Problem Relation Age of Onset  . Leukemia Father   . Stroke Mother      Social History Mr. Hackman reports that he has never smoked. He has never used smokeless tobacco. Mr. Remo has no history on file for alcohol use.   Review of Systems CONSTITUTIONAL: No weight loss, fever, chills, weakness or fatigue.  HEENT: Eyes: No visual loss, blurred vision, double vision or yellow sclerae.No hearing loss, sneezing, congestion, runny nose or sore throat.  SKIN: No rash or itching.  CARDIOVASCULAR: per hpi RESPIRATORY: No shortness of breath, cough or sputum.  GASTROINTESTINAL: No anorexia, nausea, vomiting or diarrhea. No abdominal pain or blood.  GENITOURINARY: No burning on urination, no polyuria NEUROLOGICAL: No headache, dizziness, syncope, paralysis, ataxia, numbness or tingling in the extremities. No change in bowel or bladder control.  MUSCULOSKELETAL:  No muscle, back pain, joint pain or stiffness.  LYMPHATICS: No enlarged nodes. No history of splenectomy.  PSYCHIATRIC: No history of depression or anxiety.  ENDOCRINOLOGIC: No reports of sweating, cold or heat intolerance. No polyuria or polydipsia.  Marland Kitchen   Physical Examination Today's Vitals   10/04/20 1325  BP: (!) 150/90  Pulse: 75  SpO2: 97%  Weight: (!) 305 lb 12.8 oz (138.7 kg)  Height: 5\' 10"  (1.778 m)   Body mass index is 43.88 kg/m.  Gen: resting comfortably, no acute distress HEENT: no scleral icterus, pupils equal round and reactive, no palptable cervical adenopathy,  CV: RRR, no m/r/g, no jvd Resp: Clear to auscultation bilaterally GI: abdomen is soft, non-tender,  non-distended, normal bowel sounds, no hepatosplenomegaly MSK: extremities are warm, no edema.  Skin: warm, no rash Neuro:  no focal deficits Psych: appropriate affect    Assessment and Plan  1. Pacemaker/Heart block - no symptoms, continue to follow with EP - EKG today shows A sensed V pacing  2. HTN - some recent LE edema perhaps related to higher norvasc dose of 10mg  daily We will d/c his norvasc 10, restart his prior lotrel (amlodopine/benazepril) 5/40mg  daily.   3. Afib -no symptoms, continue current meds      , M.D.

## 2020-10-07 ENCOUNTER — Telehealth: Payer: Self-pay

## 2020-10-07 ENCOUNTER — Other Ambulatory Visit: Payer: Self-pay

## 2020-10-07 MED ORDER — MIRABEGRON ER 25 MG PO TB24: 25.0000 mg | ORAL_TABLET | Freq: Every day | ORAL | 6 refills | Status: DC

## 2020-10-07 NOTE — Telephone Encounter (Signed)
Pt called saying [facility] needed a 90 supply of Myrbetriq ordered instead of 30. New rx sent.

## 2020-10-10 ENCOUNTER — Other Ambulatory Visit: Payer: Self-pay

## 2020-10-10 ENCOUNTER — Ambulatory Visit (INDEPENDENT_AMBULATORY_CARE_PROVIDER_SITE_OTHER): Payer: Medicare Other | Admitting: Urology

## 2020-10-10 VITALS — BP 155/79 | HR 91 | Temp 98.2°F | Ht 70.0 in | Wt 305.8 lb

## 2020-10-10 DIAGNOSIS — N5201 Erectile dysfunction due to arterial insufficiency: Secondary | ICD-10-CM

## 2020-10-10 NOTE — Progress Notes (Signed)
10/10/2020 3:14 PM   Jeffery Wright 28-Oct-1956 [id number]  Referring provider: Shawnie Dapper, PA-C [address] [place],  [place] [address]  Erectile dysfunction  HPI: Jeffery Wright is a 26NO here for followup for ED. He is here for trimix teaching   PMH: Past Medical History:  Diagnosis Date  . Diabetes mellitus without complication (HCC)   . Heart disease   . Sleep apnea     Surgical History: Past Surgical History:  Procedure Laterality Date  . APPENDECTOMY    . HERNIA REPAIR    . PACEMAKER IMPLANT      Home Medications:  Allergies as of 10/10/2020   No Known Allergies     Medication List       Accurate as of October 10, 2020  3:14 PM. If you have any questions, ask your nurse or doctor.        amLODipine-benazepril 5-40 MG capsule Commonly known as: LOTREL Take 1 capsule by mouth daily.   atorvastatin 40 MG tablet Commonly known as: LIPITOR Take 40 mg by mouth daily.   finasteride 5 MG tablet Commonly known as: PROSCAR Take 1 tablet (5 mg total) by mouth daily.   Gvoke HypoPen 2-Pack 1 MG/0.2ML [place] Generic drug: Glucagon Inject into the skin.   insulin lispro 100 UNIT/ML injection Commonly known as: HUMALOG Inject into the skin 3 (three) times daily before meals.   Jardiance 25 MG Tabs tablet Generic drug: empagliflozin Take 25 mg by mouth daily.   levothyroxine 137 MCG tablet Commonly known as: SYNTHROID Take 137 mcg by mouth daily.   Myrbetriq 25 MG Tb24 tablet Generic drug: mirabegron ER TAKE 1 TABLET DAILY   mirabegron ER 25 MG Tb24 tablet Commonly known as: MYRBETRIQ Take 1 tablet (25 mg total) by mouth daily.   pregabalin 75 MG capsule Commonly known as: LYRICA Take 75 mg by mouth 2 (two) times daily.   spironolactone 25 MG tablet Commonly known as: ALDACTONE Take 25 mg by mouth daily.   TRESIBA FLEXTOUCH [place] Inject into the skin.   Xarelto 20 MG Tabs tablet Generic drug: rivaroxaban Take 1 tablet  (20 mg total) by mouth daily.       Allergies: No Known Allergies  Family History: Family History  Problem Relation Age of Onset  . Leukemia Father   . Stroke Mother     Social History:  reports that he has never smoked. He has never used smokeless tobacco. No history on file for alcohol use and drug use.  ROS: All other review of systems were reviewed and are negative except what is noted above in HPI  Physical Exam: BP (!) 155/79   Pulse 91   Temp 98.2 F (36.8 C)   Ht 5\' 10"  (1.778 m)   Wt (!) 305 lb 12.8 oz (138.7 kg)   BMI 43.88 kg/m   Constitutional:  Alert and oriented, No acute distress. HEENT: [place] AT, moist mucus membranes.  Trachea midline, no masses. Cardiovascular: No clubbing, cyanosis, or edema. Respiratory: Normal respiratory effort, no increased work of breathing. GI: Abdomen is soft, nontender, nondistended, no abdominal masses GU: No CVA tenderness.  Lymph: No cervical or inguinal lymphadenopathy. Skin: No rashes, bruises or suspicious lesions. Neurologic: Grossly intact, no focal deficits, moving all 4 extremities. Psychiatric: Normal mood and affect.  Laboratory Data: No results found for: WBC, HGB, HCT, MCV, PLT  No results found for: CREATININE  No results found for: PSA  No results found for: TESTOSTERONE  No results found for: HGBA1C  Urinalysis    Component Value Date/Time   APPEARANCEUR Clear 09/26/2020 0916   GLUCOSEU 3+ (A) 09/26/2020 0916   BILIRUBINUR Negative 09/26/2020 0916   PROTEINUR Negative 09/26/2020 0916   UROBILINOGEN negative (A) 03/30/2020 1151   NITRITE Negative 09/26/2020 0916   LEUKOCYTESUR Negative 09/26/2020 0916    Lab Results  Component Value Date   LABMICR Comment 09/26/2020    Pertinent Imaging:  No results found for this or any previous visit.  No results found for this or any previous visit.  No results found for this or any previous visit.  No results found for this or any previous  visit.  No results found for this or any previous visit.  No results found for this or any previous visit.  No results found for this or any previous visit.  No results found for this or any previous visit.  Trimix injection instruction:  Patient instructed to draw 0.56ml of trimix into the insulin syringe. I them instructed him to inject at the mid penile shaft at either the 3 or 9 o'clock position. I then injected the patient and he achieved a good erection in 20 minutes. Penile injection completed.  Assessment & Plan:    1. Erectile dysfunction due to arterial insufficiency -Patient was instructed on trimix injection and achieved a firm erection with 0.75ml trimix. He is instructed to call the clinic for an erection that lasts more than 4 hours.  - Urinalysis, Routine w reflex microscopic   No follow-ups on file.  Wilkie Aye, MD  [facility] [place]

## 2020-10-10 NOTE — Progress Notes (Signed)

## 2020-10-15 ENCOUNTER — Other Ambulatory Visit: Payer: Self-pay

## 2020-10-15 ENCOUNTER — Ambulatory Visit
Admission: EM | Admit: 2020-10-15 | Discharge: 2020-10-15 | Disposition: A | Discharge status: Home / Self Care | Payer: Medicare Other | Attending: Emergency Medicine | Admitting: Emergency Medicine

## 2020-10-15 ENCOUNTER — Ambulatory Visit (INDEPENDENT_AMBULATORY_CARE_PROVIDER_SITE_OTHER): Payer: Medicare Other

## 2020-10-15 DIAGNOSIS — S99922A Unspecified injury of left foot, initial encounter: Secondary | ICD-10-CM

## 2020-10-15 DIAGNOSIS — M79672 Pain in left foot: Secondary | ICD-10-CM

## 2020-10-15 DIAGNOSIS — S92502A Displaced unspecified fracture of left lesser toe(s), initial encounter for closed fracture: Secondary | ICD-10-CM

## 2020-10-15 MED ORDER — HYDROCODONE-ACETAMINOPHEN 5-325 MG PO TABS
2.0000 | ORAL_TABLET | ORAL | 0 refills | Status: DC | PRN
Start: 2020-10-15 — End: 2021-11-21

## 2020-10-15 MED ORDER — NAPROXEN 375 MG PO TABS: 375.0000 mg | ORAL_TABLET | Freq: Two times a day (BID) | ORAL | 0 refills | Status: DC

## 2020-10-15 NOTE — ED Provider Notes (Signed)
[facility]   [id number] 10/15/20 Arrival Time: 1146  CC: LT foot PAIN  SUBJECTIVE: History from: patient. Jeffery Wright is a 64 y.o. male complains of LT foot pain x 1 day.  Kicked brick wall with LT foot trying to put boot on.  Localizes the pain to the end of LT foot.  Describes the pain as intermittent.  Denies alleviating factors. Symptoms are made worse with walking.  Complains of associated swelling, and bruising.  Denies fever, chills, erythema, weakness, numbness and tingling  States DM is well-controlled.   ROS: As per HPI.  All other pertinent ROS negative.     Past Medical History:  Diagnosis Date   Diabetes mellitus without complication (HCC)    Heart disease    Sleep apnea    Past Surgical History:  Procedure Laterality Date   APPENDECTOMY     HERNIA REPAIR     PACEMAKER IMPLANT     No Known Allergies No current facility-administered medications on file prior to encounter.   Current Outpatient Medications on File Prior to Encounter  Medication Sig Dispense Refill   amLODipine-benazepril (LOTREL) 5-40 MG capsule Take 1 capsule by mouth daily. 90 capsule 0   atorvastatin (LIPITOR) 40 MG tablet Take 40 mg by mouth daily.      finasteride (PROSCAR) 5 MG tablet Take 1 tablet (5 mg total) by mouth daily. 90 tablet 3   GVOKE HYPOPEN 2-PACK 1 MG/0.2ML SOAJ Inject into the skin.     Insulin Degludec (TRESIBA FLEXTOUCH [place]) Inject into the skin.     insulin lispro (HUMALOG) 100 UNIT/ML injection Inject into the skin 3 (three) times daily before meals.     JARDIANCE 25 MG TABS tablet Take 25 mg by mouth daily.     levothyroxine (SYNTHROID) 137 MCG tablet Take 137 mcg by mouth daily.     mirabegron ER (MYRBETRIQ) 25 MG TB24 tablet Take 1 tablet (25 mg total) by mouth daily. 90 tablet 6   MYRBETRIQ 25 MG TB24 tablet TAKE 1 TABLET DAILY 30 tablet 0   pregabalin (LYRICA) 75 MG capsule Take 75 mg by mouth 2 (two) times daily.      spironolactone (ALDACTONE) 25 MG tablet Take 25 mg by mouth daily.     XARELTO 20 MG TABS tablet Take 1 tablet (20 mg total) by mouth daily. 90 tablet 3   Social History   Socioeconomic History   Marital status: Married    Spouse name: Not on file   Number of children: 2   Years of education: Not on file   Highest education level: Not on file  Occupational History   Occupation: Research scientist (medical)  Tobacco Use   Smoking status: Never Smoker   Smokeless tobacco: Never Used  Building services engineer Use: Never used  Substance and Sexual Activity   Alcohol use: Not on file   Drug use: Not on file   Sexual activity: Not on file  Other Topics Concern   Not on file  Social History Narrative   Not on file   Social Determinants of Health   Financial Resource Strain:    Difficulty of Paying Living Expenses: Not on file  Food Insecurity:    Worried About Running Out of Food in the Last Year: Not on file   Ran Out of Food in the Last Year: Not on file  Transportation Needs:    Lack of Transportation (Medical): Not on file   Lack of Transportation (Non-Medical): Not on file  Physical Activity:    Days of Exercise per Week: Not on file   Minutes of Exercise per Session: Not on file  Stress:    Feeling of Stress : Not on file  Social Connections:    Frequency of Communication with Friends and Family: Not on file   Frequency of Social Gatherings with Friends and Family: Not on file   Attends Religious Services: Not on file   Active Member of Clubs or Organizations: Not on file   Attends Banker Meetings: Not on file   Marital Status: Not on file  Intimate Partner Violence:    Fear of Current or Ex-Partner: Not on file   Emotionally Abused: Not on file   Physically Abused: Not on file   Sexually Abused: Not on file   Family History  Problem Relation Age of Onset   Leukemia Father    Stroke Mother     OBJECTIVE:  Vitals:   10/15/20 1205    BP: (!) 151/78  Pulse: 76  Resp: 17  Temp: 97.9 F (36.6 C)  TempSrc: Oral  SpO2: 95%    General appearance: ALERT; in no acute distress.  Head: NCAT Lungs: Normal respiratory effort CV: Dorsalis pedis pulse 2+ Musculoskeletal: LT foot Inspection: swelling and significant ecchymosis to distal 2-4th digits Palpation: Diffusely TTP over 2-4th MTP joints and digits ROM: FROM active and passive Strength: deferred Skin: warm and dry Neurologic: Ambulates with minimal difficulty; Sensation intact about the lower extremities Psychological: alert and cooperative; normal mood and affect  DIAGNOSTIC STUDIES:  DG Foot Complete Left  Addendum Date: 10/15/2020   ADDENDUM REPORT: 10/15/2020 13:24 ADDENDUM: On second review, there is a probable nondisplaced impacted fracture of the distal aspect of the proximal second phalanx. Electronically Signed   By: Ted Mcalpine M.D.   On: 10/15/2020 13:24   Result Date: 10/15/2020 CLINICAL DATA:  Blunt trauma to the 2nd to 4th digit. EXAM: LEFT FOOT - COMPLETE 3+ VIEW COMPARISON:  None. FINDINGS: There is no evidence of fracture or dislocation. There is no evidence of arthropathy or other focal bone abnormality. Soft tissues are unremarkable. IMPRESSION: Negative. Electronically Signed: By: Ted Mcalpine M.D. On: 10/15/2020 12:53     X-rays positive for second proximal phalanx fracture  I have reviewed the x-rays myself and the radiologist interpretation. I am in agreement with the radiologist interpretation.     ASSESSMENT & PLAN:  1. Left foot pain   2. Closed fracture of phalanx of left second toe, initial encounter     Meds ordered this encounter  Medications   naproxen (NAPROSYN) 375 MG tablet    Sig: Take 1 tablet (375 mg total) by mouth 2 (two) times daily.    Dispense:  20 tablet    Refill:  0    Order Specific Question:   Supervising Provider    Answer:   Eustace Moore [[id number]]   HYDROcodone-acetaminophen  (NORCO/VICODIN) 5-325 MG tablet    Sig: Take 2 tablets by mouth every 4 (four) hours as needed.    Dispense:  6 tablet    Refill:  0    Order Specific Question:   Supervising Provider    Answer:   Eustace Moore [[id number]]   X-rays concerning for second toe fracture Continue conservative management of rest, ice, and elevation Post-op shoe applied  Take naproxen as needed for pain relief (may cause abdominal discomfort, ulcers, and GI bleeds avoid taking with other NSAIDs) Norco for severe break-through pain.  DO NOT TAKE prior to driving or operating heavy machinery as this medication may cause drowsiness Follow up with orthopedist/ foot doctor this week for further evaluation and management Return or go to the ER if you have any new or worsening symptoms (fever, chills, chest pain, redness, swelling, deformity, numbness/ tingling, etc...)   Reviewed expectations re: course of current medical issues. Questions answered. Outlined signs and symptoms indicating need for more acute intervention. Patient verbalized understanding. After Visit Summary given.    Rennis Harding, PA-C 10/15/20 1356

## 2020-10-15 NOTE — Discharge Instructions (Signed)
X-rays concerning for second toe fracture Continue conservative management of rest, ice, and elevation Post-op shoe applied  Take naproxen as needed for pain relief (may cause abdominal discomfort, ulcers, and GI bleeds avoid taking with other NSAIDs) Norco for severe break-through pain.  DO NOT TAKE prior to driving or operating heavy machinery as this medication may cause drowsiness Follow up with orthopedist/ foot doctor this week for further evaluation and management Return or go to the ER if you have any new or worsening symptoms (fever, chills, chest pain, redness, swelling, deformity, numbness/ tingling, etc...)

## 2020-10-15 NOTE — ED Triage Notes (Signed)
Pt has left toe injury while kicking bot off, hit bricks, severe bruising toted

## 2020-10-18 ENCOUNTER — Ambulatory Visit (INDEPENDENT_AMBULATORY_CARE_PROVIDER_SITE_OTHER): Payer: Medicare Other | Admitting: Orthopaedic Surgery

## 2020-10-18 ENCOUNTER — Encounter: Payer: Self-pay | Admitting: Orthopaedic Surgery

## 2020-10-18 ENCOUNTER — Other Ambulatory Visit: Payer: Self-pay

## 2020-10-18 VITALS — BP 120/80 | HR 74 | Ht 70.0 in | Wt 304.0 lb

## 2020-10-18 DIAGNOSIS — E119 Type 2 diabetes mellitus without complications: Secondary | ICD-10-CM | POA: Diagnosis not present

## 2020-10-18 DIAGNOSIS — S92502A Displaced unspecified fracture of left lesser toe(s), initial encounter for closed fracture: Secondary | ICD-10-CM

## 2020-10-18 NOTE — Progress Notes (Signed)
Subjective:    Patient ID: Jeffery Wright, male    DOB: 05-29-1956, 64 y.o.   MRN: [id number]  HPI He hit his left foot and hurt his left second and third toes on 10-14-20.  He was seen in Urgent Care.  X-rays showed fracture of the second toe middle phalanx.  He has had abrasions of the toes and swelling.  He is a diabetic.  He is wearing tennis shoe, loose fitting.  He has no other injury.  I have reviewed the notes and the X-rays.  I have independently reviewed and interpreted x-rays of this patient done at another site by another physician or qualified health professional.     Review of Systems  Constitutional: Positive for activity change.  Musculoskeletal: Positive for arthralgias, gait problem and joint swelling.  All other systems reviewed and are negative.  For Review of Systems, all other systems reviewed and are negative.  The following is a summary of the past history medically, past history surgically, known current medicines, social history and family history.  This information is gathered electronically by the computer from prior information and documentation.  I review this each visit and have found including this information at this point in the chart is beneficial and informative.   Past Medical History:  Diagnosis Date   Diabetes mellitus without complication (HCC)    Heart disease    Sleep apnea     Past Surgical History:  Procedure Laterality Date   APPENDECTOMY     HERNIA REPAIR     PACEMAKER IMPLANT      Current Outpatient Medications on File Prior to Visit  Medication Sig Dispense Refill   amLODipine-benazepril (LOTREL) 5-40 MG capsule Take 1 capsule by mouth daily. 90 capsule 0   atorvastatin (LIPITOR) 40 MG tablet Take 40 mg by mouth daily.      finasteride (PROSCAR) 5 MG tablet Take 1 tablet (5 mg total) by mouth daily. 90 tablet 3   GVOKE HYPOPEN 2-PACK 1 MG/0.2ML SOAJ Inject into the skin.     HYDROcodone-acetaminophen  (NORCO/VICODIN) 5-325 MG tablet Take 2 tablets by mouth every 4 (four) hours as needed. 6 tablet 0   Insulin Degludec (TRESIBA FLEXTOUCH [place]) Inject into the skin.     insulin lispro (HUMALOG) 100 UNIT/ML injection Inject into the skin 3 (three) times daily before meals.     JARDIANCE 25 MG TABS tablet Take 25 mg by mouth daily.     levothyroxine (SYNTHROID) 137 MCG tablet Take 137 mcg by mouth daily.     mirabegron ER (MYRBETRIQ) 25 MG TB24 tablet Take 1 tablet (25 mg total) by mouth daily. 90 tablet 6   MYRBETRIQ 25 MG TB24 tablet TAKE 1 TABLET DAILY 30 tablet 0   naproxen (NAPROSYN) 375 MG tablet Take 1 tablet (375 mg total) by mouth 2 (two) times daily. 20 tablet 0   pregabalin (LYRICA) 75 MG capsule Take 75 mg by mouth 2 (two) times daily.     spironolactone (ALDACTONE) 25 MG tablet Take 25 mg by mouth daily.     XARELTO 20 MG TABS tablet Take 1 tablet (20 mg total) by mouth daily. 90 tablet 3   No current facility-administered medications on file prior to visit.    Social History   Socioeconomic History   Marital status: Married    Spouse name: Not on file   Number of children: 2   Years of education: Not on file   Highest education level: Not on file  Occupational History   Occupation: Research scientist (medical)  Tobacco Use   Smoking status: Never Smoker   Smokeless tobacco: Never Used  Building services engineer Use: Never used  Substance and Sexual Activity   Alcohol use: Not on file   Drug use: Not on file   Sexual activity: Not on file  Other Topics Concern   Not on file  Social History Narrative   Not on file   Social Determinants of Health   Financial Resource Strain:    Difficulty of Paying Living Expenses: Not on file  Food Insecurity:    Worried About Running Out of Food in the Last Year: Not on file   Ran Out of Food in the Last Year: Not on file  Transportation Needs:    Lack of Transportation (Medical): Not on file   Lack of Transportation  (Non-Medical): Not on file  Physical Activity:    Days of Exercise per Week: Not on file   Minutes of Exercise per Session: Not on file  Stress:    Feeling of Stress : Not on file  Social Connections:    Frequency of Communication with Friends and Family: Not on file   Frequency of Social Gatherings with Friends and Family: Not on file   Attends Religious Services: Not on file   Active Member of Clubs or Organizations: Not on file   Attends Banker Meetings: Not on file   Marital Status: Not on file  Intimate Partner Violence:    Fear of Current or Ex-Partner: Not on file   Emotionally Abused: Not on file   Physically Abused: Not on file   Sexually Abused: Not on file    Family History  Problem Relation Age of Onset   Leukemia Father    Stroke Mother     BP 120/80    Pulse 74    Ht 5\' 10"  (1.778 m)    Wt (!) 304 lb (137.9 kg)    BMI 43.62 kg/m   Body mass index is 43.62 kg/m.      Objective:   Physical Exam Vitals and nursing note reviewed. Exam conducted with a chaperone present.  Constitutional:      Appearance: He is well-developed.  HENT:     Head: Normocephalic and atraumatic.  Eyes:     Conjunctiva/sclera: Conjunctivae normal.     Pupils: Pupils are equal, round, and reactive to light.  Cardiovascular:     Rate and Rhythm: Normal rate and regular rhythm.  Pulmonary:     Effort: Pulmonary effort is normal.  Abdominal:     Palpations: Abdomen is soft.  Musculoskeletal:     Cervical back: Normal range of motion and neck supple.       Feet:  Skin:    General: Skin is warm and dry.  Neurological:     Mental Status: He is alert and oriented to person, place, and time.     Cranial Nerves: No cranial nerve deficit.     Motor: No abnormal muscle tone.     Coordination: Coordination normal.     Deep Tendon Reflexes: Reflexes are normal and symmetric. Reflexes normal.  Psychiatric:        Behavior: Behavior normal.        Thought  Content: Thought content normal.        Judgment: Judgment normal.           Assessment & Plan:   Encounter Diagnoses  Name Primary?   Fracture of second  toe, left, closed, initial encounter Yes   Diabetes mellitus without complication (HCC)    I have spent some time with him explaining the injury.  He is a diabetic and he needs to check his toes at least three times a day or even more.  As a diabetic, the problem of circulation or infection can happen quickly.  If he has any problem or he thinks he may have a problem, he is to contact us right away or go to the ER.  He has used Coban for the toe wrap.  I told him not to use that again.  We have cleansed the toes with peroxide and put gauze between the second and third toes and buddy taped then.  Return in three week.  X-rays of toes then.  Call if any problem and I mean any problem.  Electronically Signed Darreld Mclean, MD 11/16/202111:09 AM

## 2020-10-18 NOTE — Patient Instructions (Signed)
Make sure you check the top and bottom of the toe 3x daily for discoloration  If you have any changes or questions let us know

## 2020-10-26 ENCOUNTER — Encounter: Payer: Self-pay | Admitting: Urology

## 2020-10-26 NOTE — Patient Instructions (Signed)
Erectile Dysfunction Erectile dysfunction (ED) is the inability to get or keep an erection in order to have sexual intercourse. Erectile dysfunction may include:  Inability to get an erection.  Lack of enough hardness of the erection to allow penetration.  Loss of the erection before sex is finished. What are the causes? This condition may be caused by:  Certain medicines, such as: ? Pain relievers. ? Antihistamines. ? Antidepressants. ? Blood pressure medicines. ? Water pills (diuretics). ? Ulcer medicines. ? Muscle relaxants. ? Drugs.  Excessive drinking.  Psychological causes, such as: ? Anxiety. ? Depression. ? Sadness. ? Exhaustion. ? Performance fear. ? Stress.  Physical causes, such as: ? Artery problems. This may include diabetes, smoking, liver disease, or atherosclerosis. ? High blood pressure. ? Hormonal problems, such as low testosterone. ? Obesity. ? Nerve problems. This may include back or pelvic injuries, diabetes mellitus, multiple sclerosis, or Parkinson disease. What are the signs or symptoms? Symptoms of this condition include:  Inability to get an erection.  Lack of enough hardness of the erection to allow penetration.  Loss of the erection before sex is finished.  Normal erections at some times, but with frequent unsatisfactory episodes.  Low sexual satisfaction in either partner due to erection problems.  A curved penis occurring with erection. The curve may cause pain or the penis may be too curved to allow for intercourse.  Never having nighttime erections. How is this diagnosed? This condition is often diagnosed by:  Performing a physical exam to find other diseases or specific problems with the penis.  Asking you detailed questions about the problem.  Performing blood tests to check for diabetes mellitus or to measure hormone levels.  Performing other tests to check for underlying health conditions.  Performing an ultrasound  exam to check for scarring.  Performing a test to check blood flow to the penis.  Doing a sleep study at home to measure nighttime erections. How is this treated? This condition may be treated by:  Medicine taken by mouth to help you achieve an erection (oral medicine).  Hormone replacement therapy to replace low testosterone levels.  Medicine that is injected into the penis. Your health care provider may instruct you how to give yourself these injections at home.  Vacuum pump. This is a pump with a ring on it. The pump and ring are placed on the penis and used to create pressure that helps the penis become erect.  Penile implant surgery. In this procedure, you may receive: ? An inflatable implant. This consists of cylinders, a pump, and a reservoir. The cylinders can be inflated with a fluid that helps to create an erection, and they can be deflated after intercourse. ? A semi-rigid implant. This consists of two silicone rubber rods. The rods provide some rigidity. They are also flexible, so the penis can both curve downward in its normal position and become straight for sexual intercourse.  Blood vessel surgery, to improve blood flow to the penis. During this procedure, a blood vessel from a different part of the body is placed into the penis to allow blood to flow around (bypass) damaged or blocked blood vessels.  Lifestyle changes, such as exercising more, losing weight, and quitting smoking. Follow these instructions at home: Medicines   Take over-the-counter and prescription medicines only as told by your health care provider. Do not increase the dosage without first discussing it with your health care provider.  If you are using self-injections, perform injections as directed by your   health care provider. Make sure to avoid any veins that are on the surface of the penis. After giving an injection, apply pressure to the injection site for 5 minutes. General  instructions  Exercise regularly, as directed by your health care provider. Work with your health care provider to lose weight, if needed.  Do not use any products that contain nicotine or tobacco, such as cigarettes and e-cigarettes. If you need help quitting, ask your health care provider.  Before using a vacuum pump, read the instructions that come with the pump and discuss any questions with your health care provider.  Keep all follow-up visits as told by your health care provider. This is important. Contact a health care provider if:  You feel nauseous.  You vomit. Get help right away if:  You are taking oral or injectable medicines and you have an erection that lasts longer than 4 hours. If your health care provider is unavailable, go to the nearest emergency room for evaluation. An erection that lasts much longer than 4 hours can result in permanent damage to your penis.  You have severe pain in your groin or abdomen.  You develop redness or severe swelling of your penis.  You have redness spreading up into your groin or lower abdomen.  You are unable to urinate.  You experience chest pain or a rapid heart beat (palpitations) after taking oral medicines. Summary  Erectile dysfunction (ED) is the inability to get or keep an erection during sexual intercourse. This problem can usually be treated successfully.  This condition is diagnosed based on a physical exam, your symptoms, and tests to determine the cause. Treatment varies depending on the cause, and may include medicines, hormone therapy, surgery, or vacuum pump.  You may need follow-up visits to make sure that you are using your medicines or devices correctly.  Get help right away if you are taking or injecting medicines and you have an erection that lasts longer than 4 hours. This information is not intended to replace advice given to you by your health care provider. Make sure you discuss any questions you have with  your health care provider. Document Revised: 11/01/2017 Document Reviewed: 12/05/2016 Elsevier Patient Education  2020 Elsevier Inc.  

## 2020-11-08 ENCOUNTER — Other Ambulatory Visit: Payer: Self-pay

## 2020-11-08 ENCOUNTER — Encounter: Payer: Self-pay | Admitting: Orthopaedic Surgery

## 2020-11-08 ENCOUNTER — Ambulatory Visit: Payer: Medicare Other

## 2020-11-08 ENCOUNTER — Ambulatory Visit (INDEPENDENT_AMBULATORY_CARE_PROVIDER_SITE_OTHER): Payer: Medicare Other | Admitting: Orthopaedic Surgery

## 2020-11-08 DIAGNOSIS — S92502D Displaced unspecified fracture of left lesser toe(s), subsequent encounter for fracture with routine healing: Secondary | ICD-10-CM | POA: Diagnosis not present

## 2020-11-08 DIAGNOSIS — S92502A Displaced unspecified fracture of left lesser toe(s), initial encounter for closed fracture: Secondary | ICD-10-CM

## 2020-11-08 MED ORDER — AMLODIPINE BESY-BENAZEPRIL HCL 5-40 MG PO CAPS
1.0000 | ORAL_CAPSULE | Freq: Every day | ORAL | 3 refills | Status: DC
Start: 2020-11-08 — End: 2020-12-28

## 2020-11-08 NOTE — Progress Notes (Signed)
I am much better  He has little pain of the left second toe.  NV intact.  He has no redness.  X-rays were done of the left second toe, reported separately.  Encounter Diagnosis  Name Primary?  . Fracture of second toe, left, closed, with routine healing, subsequent encounter Yes   I will see him in one month.  X-rays then.  Call if any problem.  Precautions discussed.   Electronically Signed Darreld Mclean, MD 12/7/20218:11 AM

## 2020-11-08 NOTE — Telephone Encounter (Signed)
Refilled amlodipine/benazapril 5-40 mg daily to [facility]

## 2020-12-06 ENCOUNTER — Ambulatory Visit: Payer: Medicare Other | Admitting: Orthopaedic Surgery

## 2020-12-21 ENCOUNTER — Ambulatory Visit (INDEPENDENT_AMBULATORY_CARE_PROVIDER_SITE_OTHER): Payer: Medicare Other

## 2020-12-21 DIAGNOSIS — I4891 Unspecified atrial fibrillation: Secondary | ICD-10-CM

## 2020-12-22 LAB — CUP PACEART REMOTE DEVICE CHECK
Battery Remaining Longevity: 40 mo
Battery Voltage: 2.97 V
Brady Statistic AP VP Percent: 35.95 %
Brady Statistic AP VS Percent: 0 %
Brady Statistic AS VP Percent: 64.02 %
Brady Statistic AS VS Percent: 0.03 %
Brady Statistic RA Percent Paced: 35.94 %
Brady Statistic RV Percent Paced: 99.95 %
Date Time Interrogation Session: 20220119121336
Implantable Lead Implant Date: 20160525
Implantable Lead Implant Date: 20160525
Implantable Lead Location: 753859
Implantable Lead Location: 753860
Implantable Lead Model: 5076
Implantable Lead Model: 5076
Implantable Pulse Generator Implant Date: 20160525
Lead Channel Impedance Value: 399 Ohm
Lead Channel Impedance Value: 437 Ohm
Lead Channel Impedance Value: 437 Ohm
Lead Channel Impedance Value: 494 Ohm
Lead Channel Pacing Threshold Amplitude: 0.5 V
Lead Channel Pacing Threshold Amplitude: 0.625 V
Lead Channel Pacing Threshold Pulse Width: 0.4 ms
Lead Channel Pacing Threshold Pulse Width: 0.4 ms
Lead Channel Sensing Intrinsic Amplitude: 13.875 mV
Lead Channel Sensing Intrinsic Amplitude: 13.875 mV
Lead Channel Sensing Intrinsic Amplitude: 2 mV
Lead Channel Sensing Intrinsic Amplitude: 2 mV
Lead Channel Setting Pacing Amplitude: 2 V
Lead Channel Setting Pacing Amplitude: 2 V
Lead Channel Setting Pacing Pulse Width: 0.4 ms
Lead Channel Setting Sensing Sensitivity: 1.2 mV

## 2020-12-28 ENCOUNTER — Other Ambulatory Visit: Payer: Self-pay | Admitting: Cardiology

## 2021-01-02 NOTE — Progress Notes (Signed)
Remote pacemaker transmission.   

## 2021-01-04 ENCOUNTER — Telehealth: Payer: Self-pay | Admitting: Cardiology

## 2021-01-04 MED ORDER — SPIRONOLACTONE 25 MG PO TABS: 25.0000 mg | ORAL_TABLET | Freq: Every day | ORAL | 3 refills | Status: DC

## 2021-01-04 NOTE — Telephone Encounter (Signed)
*  STAT* If patient is at the pharmacy, call can be transferred to refill team.   1. Which medications need to be refilled? (please list name of each medication and dose if known) spironolactone (ALDACTONE) 25 MG tablet  2. Which pharmacy/location (including street and city if local pharmacy) is medication to be sent to? [facility]   3. Do they need a 30 day or 90 day supply?  90

## 2021-01-04 NOTE — Telephone Encounter (Signed)
Medication refill request for Spironolactone approved and sent to pharmacy.

## 2021-01-16 ENCOUNTER — Other Ambulatory Visit: Payer: Self-pay | Admitting: Cardiology

## 2021-03-22 ENCOUNTER — Ambulatory Visit (INDEPENDENT_AMBULATORY_CARE_PROVIDER_SITE_OTHER): Payer: Medicare Other

## 2021-03-22 DIAGNOSIS — I442 Atrioventricular block, complete: Secondary | ICD-10-CM

## 2021-03-23 LAB — CUP PACEART REMOTE DEVICE CHECK
Battery Remaining Longevity: 39 mo
Battery Voltage: 2.97 V
Brady Statistic AP VP Percent: 41.85 %
Brady Statistic AP VS Percent: 0 %
Brady Statistic AS VP Percent: 58.14 %
Brady Statistic AS VS Percent: 0.01 %
Brady Statistic RA Percent Paced: 41.85 %
Brady Statistic RV Percent Paced: 99.99 %
Date Time Interrogation Session: 20220420092722
Implantable Lead Implant Date: 20160525
Implantable Lead Implant Date: 20160525
Implantable Lead Location: 753859
Implantable Lead Location: 753860
Implantable Lead Model: 5076
Implantable Lead Model: 5076
Implantable Pulse Generator Implant Date: 20160525
Lead Channel Impedance Value: 380 Ohm
Lead Channel Impedance Value: 418 Ohm
Lead Channel Impedance Value: 418 Ohm
Lead Channel Impedance Value: 475 Ohm
Lead Channel Pacing Threshold Amplitude: 0.5 V
Lead Channel Pacing Threshold Amplitude: 0.625 V
Lead Channel Pacing Threshold Pulse Width: 0.4 ms
Lead Channel Pacing Threshold Pulse Width: 0.4 ms
Lead Channel Sensing Intrinsic Amplitude: 14.25 mV
Lead Channel Sensing Intrinsic Amplitude: 14.25 mV
Lead Channel Sensing Intrinsic Amplitude: 2 mV
Lead Channel Sensing Intrinsic Amplitude: 2 mV
Lead Channel Setting Pacing Amplitude: 2 V
Lead Channel Setting Pacing Amplitude: 2 V
Lead Channel Setting Pacing Pulse Width: 0.4 ms
Lead Channel Setting Sensing Sensitivity: 1.2 mV

## 2021-04-07 ENCOUNTER — Ambulatory Visit: Payer: Medicare Other | Admitting: Cardiology

## 2021-04-07 ENCOUNTER — Ambulatory Visit (INDEPENDENT_AMBULATORY_CARE_PROVIDER_SITE_OTHER): Payer: Medicare Other | Admitting: Family Medicine

## 2021-04-07 ENCOUNTER — Encounter: Payer: Self-pay | Admitting: Family Medicine

## 2021-04-07 VITALS — BP 130/58 | HR 82 | Ht 70.0 in | Wt 314.0 lb

## 2021-04-07 DIAGNOSIS — I4891 Unspecified atrial fibrillation: Secondary | ICD-10-CM | POA: Diagnosis not present

## 2021-04-07 DIAGNOSIS — I340 Nonrheumatic mitral (valve) insufficiency: Secondary | ICD-10-CM | POA: Diagnosis not present

## 2021-04-07 DIAGNOSIS — Z950 Presence of cardiac pacemaker: Secondary | ICD-10-CM | POA: Diagnosis not present

## 2021-04-07 DIAGNOSIS — I10 Essential (primary) hypertension: Secondary | ICD-10-CM

## 2021-04-07 NOTE — Patient Instructions (Addendum)
Medication Instructions:  Continue all current medications.  Labwork: none  Testing/Procedures: Your physician has requested that you have an echocardiogram. Echocardiography is a painless test that uses sound waves to create images of your heart. It provides your doctor with information about the size and shape of your heart and how well your heart's chambers and valves are working. This procedure takes approximately one hour. There are no restrictions for this procedure - DUE PRIOR TO NEXT OFFICE VISIT  Follow-Up: End of October   Any Other Special Instructions Will Be Listed Below (If Applicable).  If you need a refill on your cardiac medications before your next appointment, please call your pharmacy.

## 2021-04-07 NOTE — Progress Notes (Signed)
Cardiology Office Note  Date: 04/07/2021   ID: Jeffery Wright, DOB [birth year], MRN [id number]  PCP:  Jeffery Dapper, PA-C  Cardiologist:  None Electrophysiologist:  None   Chief Complaint: 6 month follow-up.  History of Present Illness: Jeffery Wright is a 65 y.o. male with a history of essential hypertension, pacemaker/heart block, atrial fibrillation, sleep apnea.  Last seen by Jeffery Wright 10/04/2020. Has a Medtronic A2 DR 01 implanted May 2016.  Advisa DR MRI SureScan pacemaker.  No recent symptoms and recent visit with EP provider.  Was compliant with antihypertensive medications.  Had recent swelling in legs on Norvasc 10 mg.  Had been on Lotrel 5/40 milligrams daily prior to moving to [place] but was changed.  Had been on Norvasc since that time and had some lower extremity edema.  No recent palpitations or bleeding on Xarelto for his atrial fibrillation.  EKG on that visit showed atrial sensed and V pacing.  To continue following with EP.  Some recent lower extremity edema related to possible higher dose of Norvasc.  Plan to DC his Norvasc 10 and restart prior Lotrel 5-40 mg daily.  No symptoms related to atrial fibrillation.  Continuing current medications.   He is here for 64-month follow-up.  He denies any recent acute illnesses or hospitalizations.  Having some recent issues controlling blood sugars.  He denies any anginal or exertional symptoms, palpitations or arrhythmias, orthostatic symptoms symptoms, CVA or TIA-like symptoms.  PND, orthopnea, bleeding.  Denies any claudication-like symptoms, DVT or PE-like symptoms or lower extremity edema.  Recently saw endocrinologist Dr. Talmage Wright who he states he changed his statin medication.  He states he does not know the name of statin he was switched to.  He also states his levothyroxine dose was increased by endocrinology also.  He denies any bleeding on Xarelto.  States his lab work was okay other than blood sugars, lipids,  thyroid levels.  He does not have any paperwork from his endocrinologist.  He advised we could contact them and get the recent lab work and recent medication changes/physician notes.  He usually spends his summers in [place].  He states he plans to go in the next week or so and is staying til the end of September.   Past Medical History:  Diagnosis Date  . Diabetes mellitus without complication (HCC)   . Heart disease   . Sleep apnea     Past Surgical History:  Procedure Laterality Date  . APPENDECTOMY    . HERNIA REPAIR    . PACEMAKER IMPLANT      Current Outpatient Medications  Medication Sig Dispense Refill  . amLODipine-benazepril (LOTREL) 5-40 MG capsule TAKE 1 CAPSULE BY MOUTH EVERY DAY 90 capsule 3  . atorvastatin (LIPITOR) 40 MG tablet Take 40 mg by mouth daily.     . finasteride (PROSCAR) 5 MG tablet Take 1 tablet (5 mg total) by mouth daily. 90 tablet 3  . GVOKE HYPOPEN 2-PACK 1 MG/0.2ML SOAJ Inject into the skin.    Marland Kitchen HYDROcodone-acetaminophen (NORCO/VICODIN) 5-325 MG tablet Take 2 tablets by mouth every 4 (four) hours as needed. 6 tablet 0  . Insulin Degludec (TRESIBA FLEXTOUCH [place]) Inject into the skin.    Marland Kitchen insulin lispro (HUMALOG) 100 UNIT/ML injection Inject into the skin 3 (three) times daily before meals.    Marland Kitchen JARDIANCE 25 MG TABS tablet Take 25 mg by mouth daily.    Marland Kitchen levothyroxine (SYNTHROID) 137 MCG tablet Take 137 mcg by  mouth daily.    . mirabegron ER (MYRBETRIQ) 25 MG TB24 tablet Take 1 tablet (25 mg total) by mouth daily. 90 tablet 6  . MYRBETRIQ 25 MG TB24 tablet TAKE 1 TABLET DAILY 30 tablet 0  . naproxen (NAPROSYN) 375 MG tablet Take 1 tablet (375 mg total) by mouth 2 (two) times daily. 20 tablet 0  . pregabalin (LYRICA) 75 MG capsule Take 75 mg by mouth 2 (two) times daily.    Marland Kitchen spironolactone (ALDACTONE) 25 MG tablet Take 1 tablet (25 mg total) by mouth daily. 90 tablet 3  . XARELTO 20 MG TABS tablet Take 1 tablet (20 mg total) by mouth daily. 90 tablet  3   No current facility-administered medications for this visit.   Allergies:  Patient has no known allergies.   Social History: The patient  reports that he has never smoked. He has never used smokeless tobacco.   Family History: The patient's family history includes Leukemia in his father; Stroke in his mother.   ROS:  Please see the history of present illness. Otherwise, complete review of systems is positive for none.  All other systems are reviewed and negative.   Physical Exam: VS:  BP (!) 130/58   Pulse 82   Ht 5\' 10"  (1.778 m)   Wt (!) 314 lb (142.4 kg)   SpO2 95%   BMI 45.05 kg/m , BMI Body mass index is 45.05 kg/m.  Wt Readings from Last 3 Encounters:  04/07/21 (!) 314 lb (142.4 kg)  10/18/20 (!) 304 lb (137.9 kg)  10/10/20 (!) 305 lb 12.8 oz (138.7 kg)    General: Morbidly obese patient appears comfortable at rest. Neck: Supple, no elevated JVP or carotid bruits, no thyromegaly. Lungs: Clear to auscultation, nonlabored breathing at rest. Cardiac: Regular rate and rhythm, no S3 or significant systolic murmur, no pericardial rub. Extremities: No pitting edema, distal pulses 2+. Skin: Warm and dry. Musculoskeletal: No kyphosis. Neuropsychiatric: Alert and oriented x3, affect grossly appropriate.  ECG:  EKG October 04, 2020 normal sinus rhythm rate of 70, right axis deviation, left bundle branch block.  Recent Labwork: No results found for requested labs within last 8760 hours.  No results found for: CHOL, TRIG, HDL, CHOLHDL, VLDL, LDLCALC, LDLDIRECT  Other Studies Reviewed Today:   Echocardiogram 04/25/2015 from [facility]. LV: Cavity size was normal.  Wall thickness normal.  Systolic function normal.  EF 55 to 65% Wall motion normal.  Study was not technically sufficient to allow evaluation of LV diastolic function. Aortic valve: Poorly visualized, thickening. Aortic root: The aortic root is not dilated. Mitral valve: The leaflets are mildly  thickened. Left atrium: The atrium is normal size. Right ventricle: Systolic function normal.  Pericardium/extracardiac: There is no pericardial effusion.  Assessment and Plan:  1. Atrial fibrillation, unspecified type (HCC)   2. Pacemaker   3. Essential hypertension   4. Mitral valve insufficiency, unspecified etiology    1. Atrial fibrillation, unspecified type [facility]) Denies any palpitations or arrhythmias.  Continue Xarelto 20 mg daily.  2. Pacemaker Sees [facility] EP.  Recent remote device check 03/23/2021 showed histograms were appropriate.  Leads and battery stable for patient.  3. Essential hypertension Blood pressure well controlled today at 130/68.  Continue local 5/40 mg p.o. daily.  Continue spironolactone 25 mg p.o. daily.  4. Mitral valve insufficiency, unspecified etiology Last echocardiogram showed mild mitral thickening.  Patient has audible systolic murmur best heard at left lower sternal border.  Please get a repeat  echocardiogram prior to follow-up.  Medication Adjustments/Labs and Tests Ordered: Current medicines are reviewed at length with the patient today.  Concerns regarding medicines are outlined above.   Disposition: Follow-up with Jeffery Wright or APP October 2022  Signed, Rennis Harding, NP 04/07/2021 5:08 PM    [facility] at [facility] [address] [place], [place], [place] [address] Phone: [phone]; Fax: [phone]

## 2021-04-10 NOTE — Progress Notes (Signed)
Remote pacemaker transmission.   

## 2021-06-21 ENCOUNTER — Ambulatory Visit (INDEPENDENT_AMBULATORY_CARE_PROVIDER_SITE_OTHER): Payer: Medicare Other

## 2021-06-21 DIAGNOSIS — I442 Atrioventricular block, complete: Secondary | ICD-10-CM | POA: Diagnosis not present

## 2021-06-26 LAB — CUP PACEART REMOTE DEVICE CHECK
Battery Remaining Longevity: 35 mo
Battery Voltage: 2.96 V
Brady Statistic AP VP Percent: 44.12 %
Brady Statistic AP VS Percent: 0 %
Brady Statistic AS VP Percent: 55.88 %
Brady Statistic AS VS Percent: 0.01 %
Brady Statistic RA Percent Paced: 44.11 %
Brady Statistic RV Percent Paced: 99.99 %
Date Time Interrogation Session: 20220723130501
Implantable Lead Implant Date: 20160525
Implantable Lead Implant Date: 20160525
Implantable Lead Location: 753859
Implantable Lead Location: 753860
Implantable Lead Model: 5076
Implantable Lead Model: 5076
Implantable Pulse Generator Implant Date: 20160525
Lead Channel Impedance Value: 399 Ohm
Lead Channel Impedance Value: 418 Ohm
Lead Channel Impedance Value: 418 Ohm
Lead Channel Impedance Value: 456 Ohm
Lead Channel Pacing Threshold Amplitude: 0.5 V
Lead Channel Pacing Threshold Amplitude: 0.625 V
Lead Channel Pacing Threshold Pulse Width: 0.4 ms
Lead Channel Pacing Threshold Pulse Width: 0.4 ms
Lead Channel Sensing Intrinsic Amplitude: 14.25 mV
Lead Channel Sensing Intrinsic Amplitude: 14.25 mV
Lead Channel Sensing Intrinsic Amplitude: 2.375 mV
Lead Channel Sensing Intrinsic Amplitude: 2.375 mV
Lead Channel Setting Pacing Amplitude: 2 V
Lead Channel Setting Pacing Amplitude: 2 V
Lead Channel Setting Pacing Pulse Width: 0.4 ms
Lead Channel Setting Sensing Sensitivity: 1.2 mV

## 2021-07-13 NOTE — Progress Notes (Signed)
Remote pacemaker transmission.   

## 2021-08-24 ENCOUNTER — Ambulatory Visit (INDEPENDENT_AMBULATORY_CARE_PROVIDER_SITE_OTHER): Payer: Medicare Other

## 2021-08-24 DIAGNOSIS — I340 Nonrheumatic mitral (valve) insufficiency: Secondary | ICD-10-CM

## 2021-08-24 LAB — ECHOCARDIOGRAM COMPLETE
AR max vel: 1.42 cm2
AV Area VTI: 1.45 cm2
AV Area mean vel: 1.6 cm2
AV Mean grad: 7.9 mmHg
AV Peak grad: 16 mmHg
Ao pk vel: 2 m/s
Area-P 1/2: 2.1 cm2
S' Lateral: 3.55 cm

## 2021-08-25 ENCOUNTER — Telehealth: Payer: Self-pay | Admitting: *Deleted

## 2021-08-25 NOTE — Telephone Encounter (Signed)
Lesle Chris, LPN  3/88/7195  9:06 AM EDT Back to Top    Notified, copy to pcp.

## 2021-08-25 NOTE — Telephone Encounter (Signed)
-----   Message from Netta Neat., NP sent at 08/24/2021  4:46 PM EDT ----- Please call the patient and let him know the echocardiogram showed the pumping function of his heart is normal.  The main pumping chamber was moderately more muscular than normal.  The key to management is to keep blood pressure at or below 130/80 consistently and manage all other risk factors.  No leaky valves noted.   Netta Neat, NP  08/24/2021 4:44 PM

## 2021-09-06 ENCOUNTER — Other Ambulatory Visit: Payer: Medicare Other

## 2021-09-25 ENCOUNTER — Ambulatory Visit: Payer: Medicare Other | Admitting: Urology

## 2021-09-26 ENCOUNTER — Encounter: Payer: Medicare Other | Admitting: Internal Medicine

## 2021-10-04 ENCOUNTER — Ambulatory Visit: Payer: Medicare Other | Admitting: Cardiology

## 2021-10-10 ENCOUNTER — Encounter: Payer: Self-pay | Admitting: Internal Medicine

## 2021-10-10 ENCOUNTER — Other Ambulatory Visit: Payer: Self-pay

## 2021-10-10 ENCOUNTER — Ambulatory Visit (INDEPENDENT_AMBULATORY_CARE_PROVIDER_SITE_OTHER): Payer: Medicare Other | Admitting: Internal Medicine

## 2021-10-10 VITALS — BP 150/86 | HR 70 | Ht 70.0 in | Wt 315.6 lb

## 2021-10-10 DIAGNOSIS — I4891 Unspecified atrial fibrillation: Secondary | ICD-10-CM | POA: Diagnosis not present

## 2021-10-10 DIAGNOSIS — I442 Atrioventricular block, complete: Secondary | ICD-10-CM | POA: Diagnosis not present

## 2021-10-10 NOTE — Progress Notes (Signed)
HPI Jeffery Wright presents today for ongoing evaluation and management of his medtronic PPM. He is a pleasant 65 yo man with CHB, s/p PPM insertion, DM, morbid obesity, and HTN. He denies chest pressure or sob. No edema. He has been compliant with his meds.   No Known Allergies   Current Outpatient Medications  Medication Sig Dispense Refill   amLODipine-benazepril (LOTREL) 5-40 MG capsule TAKE 1 CAPSULE BY MOUTH EVERY DAY 90 capsule 3   atorvastatin (LIPITOR) 40 MG tablet Take 40 mg by mouth daily.      finasteride (PROSCAR) 5 MG tablet Take 1 tablet (5 mg total) by mouth daily. 90 tablet 3   GVOKE HYPOPEN 2-PACK 1 MG/0.2ML SOAJ Inject into the skin.     Insulin Degludec (TRESIBA FLEXTOUCH [place]) Inject into the skin.     insulin lispro (HUMALOG) 100 UNIT/ML injection Inject into the skin 3 (three) times daily before meals.     JARDIANCE 25 MG TABS tablet Take 25 mg by mouth daily.     levothyroxine (SYNTHROID) 137 MCG tablet Take 145 mcg by mouth daily.     mirabegron ER (MYRBETRIQ) 25 MG TB24 tablet Take 1 tablet (25 mg total) by mouth daily. 90 tablet 6   naproxen (NAPROSYN) 375 MG tablet Take 1 tablet (375 mg total) by mouth 2 (two) times daily. 20 tablet 0   spironolactone (ALDACTONE) 25 MG tablet Take 1 tablet (25 mg total) by mouth daily. 90 tablet 3   TRESIBA FLEXTOUCH 200 UNIT/ML FlexTouch Pen Inject into the skin.     XARELTO 20 MG TABS tablet Take 1 tablet (20 mg total) by mouth daily. 90 tablet 3   HYDROcodone-acetaminophen (NORCO/VICODIN) 5-325 MG tablet Take 2 tablets by mouth every 4 (four) hours as needed. (Patient not taking: Reported on 10/10/2021) 6 tablet 0   MYRBETRIQ 25 MG TB24 tablet TAKE 1 TABLET DAILY (Patient not taking: Reported on 10/10/2021) 30 tablet 0   pregabalin (LYRICA) 75 MG capsule Take 75 mg by mouth 2 (two) times daily. (Patient not taking: Reported on 10/10/2021)     No current facility-administered medications for this visit.     Past Medical  History:  Diagnosis Date   Diabetes mellitus without complication (HCC)    Heart disease    Sleep apnea     ROS:   All systems reviewed and negative except as noted in the HPI.   Past Surgical History:  Procedure Laterality Date   APPENDECTOMY     HERNIA REPAIR     PACEMAKER IMPLANT       Family History  Problem Relation Age of Onset   Leukemia Father    Stroke Mother      Social History   Socioeconomic History   Marital status: Married    Spouse name: Not on file   Number of children: 2   Years of education: Not on file   Highest education level: Not on file  Occupational History   Occupation: Research scientist (medical)  Tobacco Use   Smoking status: Never   Smokeless tobacco: Never  Vaping Use   Vaping Use: Never used  Substance and Sexual Activity   Alcohol use: Not on file   Drug use: Not on file   Sexual activity: Not on file  Other Topics Concern   Not on file  Social History Narrative   Not on file   Social Determinants of Health   Financial Resource Strain: Not on file  Food Insecurity: Not on  file  Transportation Needs: Not on file  Physical Activity: Not on file  Stress: Not on file  Social Connections: Not on file  Intimate Partner Violence: Not on file     BP (!) 150/86   Pulse 70   Ht 5\' 10"  (1.778 m)   Wt (!) 315 lb 9.6 oz (143.2 kg)   SpO2 96%   BMI 45.28 kg/m   Physical Exam:  Well appearing NAD HEENT: Unremarkable Neck:  No JVD, no thyromegally Lymphatics:  No adenopathy Back:  No CVA tenderness Lungs:  Clear with no wheezes HEART:  Regular rate rhythm, no murmurs, no rubs, no clicks Abd:  soft, positive bowel sounds, no organomegally, no rebound, no guarding Ext:  2 plus pulses, no edema, no cyanosis, no clubbing Skin:  No rashes no nodules Neuro:  CN II through XII intact, motor grossly intact  EKG - NSR with AV pacing  DEVICE  Normal device function.  See PaceArt for details.   Assess/Plan:  1. CHB - he is asymptomatic,  s/p PPM insertion. 2. PPM - he has about 2.5 years on his PPM battery. 3. HTN - his bp is well controlled.  4. PAF - he is maintaining NSR. He will continue xarelto.   Jeffery Mcshan,MD

## 2021-10-10 NOTE — Patient Instructions (Signed)
Medication Instructions:  Your physician recommends that you continue on your current medications as directed. Please refer to the Current Medication list given to you today.  *If you need a refill on your cardiac medications before your next appointment, please call your pharmacy*   Lab Work: NONE   If you have labs (blood work) drawn today and your tests are completely normal, you will receive your results only by: . MyChart Message (if you have MyChart) OR . A paper copy in the mail If you have any lab test that is abnormal or we need to change your treatment, we will call you to review the results.   Testing/Procedures: NONE    Follow-Up: At [facility], you and your health needs are our priority.  As part of our continuing mission to provide you with exceptional heart care, we have created designated Provider Care Teams.  These Care Teams include your primary Cardiologist (physician) and Advanced Practice Providers (APPs -  Physician Assistants and Nurse Practitioners) who all work together to provide you with the care you need, when you need it.  We recommend signing up for the patient portal called "MyChart".  Sign up information is provided on this After Visit Summary.  MyChart is used to connect with patients for Virtual Visits (Telemedicine).  Patients are able to view lab/test results, encounter notes, upcoming appointments, etc.  Non-urgent messages can be sent to your provider as well.   To learn more about what you can do with MyChart, go to [email or web address].    Your next appointment:   1 year(s)  The format for your next appointment:   In Person  Provider:   Gregg Taylor, MD   Other Instructions Thank you for choosing [facility]!    

## 2021-10-16 ENCOUNTER — Other Ambulatory Visit: Payer: Self-pay | Admitting: Urology

## 2021-10-19 ENCOUNTER — Other Ambulatory Visit: Payer: Self-pay | Admitting: Cardiology

## 2021-10-19 NOTE — Telephone Encounter (Signed)
Prescription refill request for Xarelto received.  Indication: Atrial fib Last office visit: 10/10/21  Rosette Reveal MD Weight: 143.2kg Age: 65 Scr: 0.8 on 08/22/20 CrCl: 181.49  Based on above findings Xarelto 20mg  daily is the appropriate dose.  Pt is past due for lab work.  He has appt with Dr on 11/17/21.  Requested CBC/BMP to be done at that time. Refill approved x 1

## 2021-11-10 ENCOUNTER — Ambulatory Visit (INDEPENDENT_AMBULATORY_CARE_PROVIDER_SITE_OTHER): Payer: Medicare Other | Admitting: Urology

## 2021-11-10 ENCOUNTER — Other Ambulatory Visit: Payer: Self-pay

## 2021-11-10 ENCOUNTER — Encounter: Payer: Self-pay | Admitting: Urology

## 2021-11-10 VITALS — BP 145/66 | HR 75

## 2021-11-10 DIAGNOSIS — N138 Other obstructive and reflux uropathy: Secondary | ICD-10-CM | POA: Diagnosis not present

## 2021-11-10 DIAGNOSIS — N401 Enlarged prostate with lower urinary tract symptoms: Secondary | ICD-10-CM

## 2021-11-10 DIAGNOSIS — N3281 Overactive bladder: Secondary | ICD-10-CM

## 2021-11-10 DIAGNOSIS — N5201 Erectile dysfunction due to arterial insufficiency: Secondary | ICD-10-CM | POA: Diagnosis not present

## 2021-11-10 LAB — URINALYSIS, ROUTINE W REFLEX MICROSCOPIC
Bilirubin, UA: NEGATIVE
Ketones, UA: NEGATIVE
Leukocytes,UA: NEGATIVE
Nitrite, UA: NEGATIVE
Protein,UA: NEGATIVE
RBC, UA: NEGATIVE
Specific Gravity, UA: 1.015 (ref 1.005–1.030)
Urobilinogen, Ur: 0.2 mg/dL (ref 0.2–1.0)
pH, UA: 5 (ref 5.0–7.5)

## 2021-11-10 MED ORDER — FINASTERIDE 5 MG PO TABS: 5.0000 mg | ORAL_TABLET | Freq: Every day | ORAL | 3 refills | Status: DC

## 2021-11-10 MED ORDER — MIRABEGRON ER 25 MG PO TB24: 25.0000 mg | ORAL_TABLET | Freq: Every day | ORAL | 11 refills | Status: DC

## 2021-11-10 NOTE — Patient Instructions (Signed)

## 2021-11-10 NOTE — Progress Notes (Signed)
post void residual=5  Urological Symptom Review  Patient is experiencing the following symptoms: Frequent urination Hard to postpone urination Get up at night to urinate Leakage of urine Erection problems (male only)   Review of Systems  Gastrointestinal (upper)  : Negative for upper GI symptoms  Gastrointestinal (lower) : Negative for lower GI symptoms  Constitutional : Negative for symptoms  Skin: Negative for skin symptoms  Eyes: Negative for eye symptoms  Ear/Nose/Throat : Negative for Ear/Nose/Throat symptoms  Hematologic/Lymphatic: Negative for Hematologic/Lymphatic symptoms  Cardiovascular : Negative for cardiovascular symptoms  Respiratory : Negative for respiratory symptoms  Endocrine: Negative for endocrine symptoms  Musculoskeletal: Negative for musculoskeletal symptoms  Neurological: Negative for neurological symptoms  Psychologic: Negative for psychiatric symptoms

## 2021-11-10 NOTE — Progress Notes (Signed)
11/10/2021 11:06 AM   Jeffery Wright 07-May-1956 [id number]  Referring provider: Shawnie Dapper, PA-C [address] [place],  [place] [address]  Followup OAB and erectile dysfunction   HPI: Jeffery Wright is a 65yo here for followup for OAB and erectile dysfunction. He uses 0.33ml of trimix with fair results. Penile pain last 1 hour. He has stable urinary urgency and frequency on mirabegron 25mg  daily. IPSS 11 QOL 2.    PMH: Past Medical History:  Diagnosis Date   Diabetes mellitus without complication (HCC)    Heart disease    Sleep apnea     Surgical History: Past Surgical History:  Procedure Laterality Date   APPENDECTOMY     HERNIA REPAIR     PACEMAKER IMPLANT      Home Medications:  Allergies as of 11/10/2021       Reactions   Metformin Other (See Comments)        Medication List        Accurate as of November 10, 2021 11:06 AM. If you have any questions, ask your nurse or doctor.          amLODipine-benazepril 5-40 MG capsule Commonly known as: LOTREL TAKE 1 CAPSULE BY MOUTH EVERY DAY   atorvastatin 40 MG tablet Commonly known as: LIPITOR Take 40 mg by mouth daily.   finasteride 5 MG tablet Commonly known as: PROSCAR Take 1 tablet (5 mg total) by mouth daily.   Gvoke HypoPen 2-Pack 1 MG/0.2ML Soaj Generic drug: Glucagon Inject into the skin.   HYDROcodone-acetaminophen 5-325 MG tablet Commonly known as: NORCO/VICODIN Take 2 tablets by mouth every 4 (four) hours as needed.   insulin lispro 100 UNIT/ML injection Commonly known as: HUMALOG Inject into the skin 3 (three) times daily before meals.   Jardiance 25 MG Tabs tablet Generic drug: empagliflozin Take 25 mg by mouth daily.   levothyroxine 137 MCG tablet Commonly known as: SYNTHROID Take 145 mcg by mouth daily.   Myrbetriq 25 MG Tb24 tablet Generic drug: mirabegron ER TAKE 1 TABLET DAILY   Myrbetriq 25 MG Tb24 tablet Generic drug: mirabegron ER TAKE 1 TABLET  DAILY   naproxen 375 MG tablet Commonly known as: NAPROSYN Take 1 tablet (375 mg total) by mouth 2 (two) times daily.   pregabalin 75 MG capsule Commonly known as: LYRICA Take 75 mg by mouth 2 (two) times daily.   spironolactone 25 MG tablet Commonly known as: ALDACTONE Take 1 tablet (25 mg total) by mouth daily.   TRESIBA FLEXTOUCH [place] Inject into the skin.   November 12, 2021 FlexTouch 200 UNIT/ML FlexTouch Pen Generic drug: insulin degludec Inject into the skin.   Xarelto 20 MG Tabs tablet Generic drug: rivaroxaban TAKE 1 TABLET DAILY        Allergies:  Allergies  Allergen Reactions   Metformin Other (See Comments)    Family History: Family History  Problem Relation Age of Onset   Leukemia Father    Stroke Mother     Social History:  reports that he has never smoked. He has never used smokeless tobacco. No history on file for alcohol use and drug use.  ROS: All other review of systems were reviewed and are negative except what is noted above in HPI  Physical Exam: BP (!) 145/66   Pulse 75   Constitutional:  Alert and oriented, No acute distress. HEENT: [place] AT, moist mucus membranes.  Trachea midline, no masses. Cardiovascular: No clubbing, cyanosis, or edema. Respiratory: Normal respiratory effort, no  increased work of breathing. GI: Abdomen is soft, nontender, nondistended, no abdominal masses GU: No CVA tenderness.  Lymph: No cervical or inguinal lymphadenopathy. Skin: No rashes, bruises or suspicious lesions. Neurologic: Grossly intact, no focal deficits, moving all 4 extremities. Psychiatric: Normal mood and affect.  Laboratory Data: No results found for: WBC, HGB, HCT, MCV, PLT  No results found for: CREATININE  No results found for: PSA  No results found for: TESTOSTERONE  No results found for: HGBA1C  Urinalysis    Component Value Date/Time   APPEARANCEUR Clear 09/26/2020 0916   GLUCOSEU 3+ (A) 09/26/2020 0916   BILIRUBINUR Negative  09/26/2020 0916   PROTEINUR Negative 09/26/2020 0916   UROBILINOGEN negative (A) 03/30/2020 1151   NITRITE Negative 09/26/2020 0916   LEUKOCYTESUR Negative 09/26/2020 0916    Lab Results  Component Value Date   LABMICR Comment 09/26/2020    Pertinent Imaging:  No results found for this or any previous visit.  No results found for this or any previous visit.  No results found for this or any previous visit.  No results found for this or any previous visit.  No results found for this or any previous visit.  No results found for this or any previous visit.  No results found for this or any previous visit.  No results found for this or any previous visit.   Assessment & Plan:    1. OAB (overactive bladder) -continue mirabegron 25mg  daily - Urinalysis, Routine w reflex microscopic - BLADDER SCAN AMB NON-IMAGING  2. Erectile dysfunction due to arterial insufficiency -continue trimix  3. Benign prostatic hyperplasia with urinary obstruction -continue finasteride   No follow-ups on file.  , MD  [facility] [place]

## 2021-11-17 ENCOUNTER — Encounter: Payer: Self-pay | Admitting: *Deleted

## 2021-11-17 ENCOUNTER — Ambulatory Visit: Payer: Medicare Other | Admitting: Cardiology

## 2021-11-17 NOTE — Progress Notes (Deleted)
Clinical Summary [name] is a 65 y.o.male  seen today for follow up of the following medical problems.        1. Pacemaker/Heart block - previously follwoed by Dr Pete Glatter [phone]) - Medtronic [id number] implanted May 2016. Advisa DR MRI sure scan pacemaker.    - no symptoms, recent visit with EP.    2. HTN - compliant with meds   - recent swelling in legs on norvasc 10mg  - reports he had been on lotrel 5/40 prior to moving to [place] but was changed here. Has been on norvasc 10mg , since on he has had some LE edema     3. Afib   - no recent palpitations - no bleeding on xarelto.        Has had moderna covid vaccine x 2     PMH HTN DM2 HL Pacemaker   Past Medical History:  Diagnosis Date   Diabetes mellitus without complication (HCC)    Heart disease    Sleep apnea      Allergies  Allergen Reactions   Metformin Other (See Comments)     Current Outpatient Medications  Medication Sig Dispense Refill   amLODipine-benazepril (LOTREL) 5-40 MG capsule TAKE 1 CAPSULE BY MOUTH EVERY DAY 90 capsule 3   atorvastatin (LIPITOR) 40 MG tablet Take 40 mg by mouth daily.      finasteride (PROSCAR) 5 MG tablet Take 1 tablet (5 mg total) by mouth daily. 90 tablet 3   GVOKE HYPOPEN 2-PACK 1 MG/0.2ML SOAJ Inject into the skin.     HYDROcodone-acetaminophen (NORCO/VICODIN) 5-325 MG tablet Take 2 tablets by mouth every 4 (four) hours as needed. 6 tablet 0   Insulin Degludec (TRESIBA FLEXTOUCH [place]) Inject into the skin.     insulin lispro (HUMALOG) 100 UNIT/ML injection Inject into the skin 3 (three) times daily before meals.     JARDIANCE 25 MG TABS tablet Take 25 mg by mouth daily.     levothyroxine (SYNTHROID) 137 MCG tablet Take 145 mcg by mouth daily.     mirabegron ER (MYRBETRIQ) 25 MG TB24 tablet Take 1 tablet (25 mg total) by mouth daily. 30 tablet 11   MYRBETRIQ 25 MG TB24 tablet TAKE 1 TABLET DAILY 90 tablet 3   naproxen (NAPROSYN) 375 MG tablet Take 1  tablet (375 mg total) by mouth 2 (two) times daily. 20 tablet 0   pregabalin (LYRICA) 75 MG capsule Take 75 mg by mouth 2 (two) times daily.     spironolactone (ALDACTONE) 25 MG tablet Take 1 tablet (25 mg total) by mouth daily. 90 tablet 3   TRESIBA FLEXTOUCH 200 UNIT/ML FlexTouch Pen Inject into the skin.     XARELTO 20 MG TABS tablet TAKE 1 TABLET DAILY 90 tablet 0   No current facility-administered medications for this visit.     Past Surgical History:  Procedure Laterality Date   APPENDECTOMY     HERNIA REPAIR     PACEMAKER IMPLANT       Allergies  Allergen Reactions   Metformin Other (See Comments)      Family History  Problem Relation Age of Onset   Leukemia Father    Stroke Mother      Social History Mr. Hochmuth reports that he has never smoked. He has never used smokeless tobacco. Mr. Dettman has no history on file for alcohol use.   Review of Systems CONSTITUTIONAL: No weight loss, fever, chills, weakness or fatigue.  HEENT: Eyes: No visual loss, blurred  vision, double vision or yellow sclerae.No hearing loss, sneezing, congestion, runny nose or sore throat.  SKIN: No rash or itching.  CARDIOVASCULAR:  RESPIRATORY: No shortness of breath, cough or sputum.  GASTROINTESTINAL: No anorexia, nausea, vomiting or diarrhea. No abdominal pain or blood.  GENITOURINARY: No burning on urination, no polyuria NEUROLOGICAL: No headache, dizziness, syncope, paralysis, ataxia, numbness or tingling in the extremities. No change in bowel or bladder control.  MUSCULOSKELETAL: No muscle, back pain, joint pain or stiffness.  LYMPHATICS: No enlarged nodes. No history of splenectomy.  PSYCHIATRIC: No history of depression or anxiety.  ENDOCRINOLOGIC: No reports of sweating, cold or heat intolerance. No polyuria or polydipsia.  Marland Kitchen   Physical Examination There were no vitals filed for this visit. There were no vitals filed for this visit.  Gen: resting comfortably, no acute  distress HEENT: no scleral icterus, pupils equal round and reactive, no palptable cervical adenopathy,  CV Resp: Clear to auscultation bilaterally GI: abdomen is soft, non-tender, non-distended, normal bowel sounds, no hepatosplenomegaly MSK: extremities are warm, no edema.  Skin: warm, no rash Neuro:  no focal deficits Psych: appropriate affect   Diagnostic Studies 08/2021 echo 1. Left ventricular ejection fraction, by estimation, is 55 to 60%. The  left ventricle has normal function. Left ventricular endocardial border  not optimally defined to evaluate regional wall motion. There is moderate  left ventricular hypertrophy. Left  ventricular diastolic parameters are indeterminate.   2. Right ventricular systolic function is normal. The right ventricular  size is normal. Tricuspid regurgitation signal is inadequate for assessing  PA pressure.   3. The mitral valve was not well visualized. No evidence of mitral valve  regurgitation. No evidence of mitral stenosis.   4. The aortic valve was not well visualized. There is mild calcification  of the aortic valve. There is mild thickening of the aortic valve. Aortic  valve regurgitation is not visualized. No aortic stenosis is present.   5. The inferior vena cava is normal in size with greater than 50%  respiratory variability, suggesting right atrial pressure of 3 mmHg.       Assessment and Plan  1. Pacemaker/Heart block - no symptoms, continue to follow with EP - EKG today shows A sensed V pacing   2. HTN - some recent LE edema perhaps related to higher norvasc dose of 10mg  daily We will d/c his norvasc 10, restart his prior lotrel (amlodopine/benazepril) 5/40mg  daily.    3. Afib -no symptoms, continue current meds      , M.D., F.A.C.C.

## 2021-11-21 ENCOUNTER — Other Ambulatory Visit: Payer: Self-pay

## 2021-11-21 ENCOUNTER — Ambulatory Visit
Admission: EM | Admit: 2021-11-21 | Discharge: 2021-11-21 | Disposition: A | Discharge status: Home / Self Care | Payer: Medicare Other | Attending: Student | Admitting: Student

## 2021-11-21 DIAGNOSIS — R21 Rash and other nonspecific skin eruption: Secondary | ICD-10-CM | POA: Diagnosis not present

## 2021-11-21 MED ORDER — HYDROCORTISONE 2.5 % EX LOTN: TOPICAL_LOTION | Freq: Two times a day (BID) | CUTANEOUS | 0 refills | Status: AC

## 2021-11-21 NOTE — ED Triage Notes (Signed)
Pt reports itching rash in arms and legs x 3 days. States he had diarrhea and fever 3 days ago.

## 2021-11-21 NOTE — ED Provider Notes (Signed)
[facility]    CSN: [id number] Arrival date & time: 11/21/21  0843      History   Chief Complaint Chief Complaint  Patient presents with   Rash    HPI Jeffery Wright is a 65 y.o. male presenting with itchy rash on arms and legs for about 3 days following a viral URI.  Initially had diarrhea, cough; this has completely resolved, but the rash has persisted.  States that a family member has similar symptoms.  He denies new products or lifestyle changes, including time outside, new laundry detergent, new soap, etc.  The rash is very itchy, and states that it has a maculopapular appearance on the arms and the legs.  It resolved with Benadryl but then returns few hours later, so he is taking Benadryl every 4 hours.  Denies any facial involvement, shortness of breath, sensation of throat closing, dizziness, nausea/vomiting/diarrhea. He is a diabetic.  HPI  Past Medical History:  Diagnosis Date   Diabetes mellitus without complication (HCC)    Heart disease    Sleep apnea     Patient Active Problem List   Diagnosis Date Noted   Diabetes mellitus without complication (HCC)    Benign prostatic hyperplasia with urinary obstruction 09/26/2020   Nocturia 09/26/2020   OAB (overactive bladder) 03/30/2020    Past Surgical History:  Procedure Laterality Date   APPENDECTOMY     HERNIA REPAIR     PACEMAKER IMPLANT         Home Medications    Prior to Admission medications   Medication Sig Start Date End Date Taking? Authorizing Provider  hydrocortisone 2.5 % lotion Apply topically 2 (two) times daily for 7 days. 11/21/21 11/28/21 Yes Rhys MartiniGraham, Hedda Crumbley E, PA-C  amLODipine-benazepril (LOTREL) 5-40 MG capsule TAKE 1 CAPSULE BY MOUTH EVERY DAY 12/28/20   Antoine PocheBranch, Jonathan F, MD  atorvastatin (LIPITOR) 40 MG tablet Take 40 mg by mouth daily.  12/15/19   [provider]  finasteride (PROSCAR) 5 MG tablet Take 1 tablet (5 mg total) by mouth daily. 11/10/21   McKenzie,  Mardene CelestePatrick L, MD  GVOKE HYPOPEN 2-PACK 1 MG/0.2ML SOAJ Inject into the skin. 10/04/20   [provider]  Insulin Degludec (TRESIBA FLEXTOUCH [place]) Inject into the skin.    [provider]  insulin lispro (HUMALOG) 100 UNIT/ML injection Inject into the skin 3 (three) times daily before meals.    [provider]  JARDIANCE 25 MG TABS tablet Take 25 mg by mouth daily. 12/20/19   [provider]  levothyroxine (SYNTHROID) 137 MCG tablet Take 145 mcg by mouth daily. 08/24/20   [provider]  mirabegron ER (MYRBETRIQ) 25 MG TB24 tablet Take 1 tablet (25 mg total) by mouth daily. 11/10/21   Malen GauzeMcKenzie, Patrick L, MD  MYRBETRIQ 25 MG TB24 tablet TAKE 1 TABLET DAILY 10/16/21   Malen GauzeMcKenzie, Patrick L, MD  naproxen (NAPROSYN) 375 MG tablet Take 1 tablet (375 mg total) by mouth 2 (two) times daily. 10/15/20   Wurst, GrenadaBrittany, PA-C  pregabalin (LYRICA) 75 MG capsule Take 75 mg by mouth 2 (two) times daily. 10/04/20   [provider]  spironolactone (ALDACTONE) 25 MG tablet Take 1 tablet (25 mg total) by mouth daily. 01/04/21   Antoine PocheBranch, Jonathan F, MD  TRESIBA FLEXTOUCH 200 UNIT/ML FlexTouch Pen Inject into the skin. 10/09/21   [provider]  XARELTO 20 MG TABS tablet TAKE 1 TABLET DAILY 10/19/21   Antoine PocheBranch, Jonathan F, MD    Family History  Family History  Problem Relation Age of Onset   Leukemia Father    Stroke Mother     Social History Social History   Tobacco Use   Smoking status: Never   Smokeless tobacco: Never  Vaping Use   Vaping Use: Never used  Substance Use Topics   Alcohol use: Not Currently   Drug use: Never     Allergies   Metformin   Review of Systems Review of Systems  Skin:  Positive for rash.  All other systems reviewed and are negative.   Physical Exam Triage Vital Signs ED Triage Vitals  Enc Vitals Group     BP 11/21/21 0913 131/63     Pulse Rate 11/21/21 0913 66     Resp 11/21/21 0913 (!) 22     Temp 11/21/21  0913 98.5 F (36.9 C)     Temp Source 11/21/21 0913 Oral     SpO2 11/21/21 0913 96 %     Weight --      Height --      Head Circumference --      Peak Flow --      Pain Score 11/21/21 0911 0     Pain Loc --      Pain Edu? --      Excl. in GC? --    No data found.  Updated Vital Signs BP 131/63 (BP Location: Right Arm)    Pulse 66    Temp 98.5 F (36.9 C) (Oral)    Resp (!) 22    SpO2 96%   Visual Acuity Right Eye Distance:   Left Eye Distance:   Bilateral Distance:    Right Eye Near:   Left Eye Near:    Bilateral Near:     Physical Exam Vitals reviewed.  Constitutional:      General: He is not in acute distress.    Appearance: Normal appearance. He is not ill-appearing or diaphoretic.  HENT:     Head: Normocephalic and atraumatic.     Mouth/Throat:     Pharynx: Uvula midline. No posterior oropharyngeal erythema.     Tonsils: No tonsillar exudate.     Comments: Airway is patent. No facial/lip/tongue involvement. Cardiovascular:     Rate and Rhythm: Normal rate and regular rhythm.     Heart sounds: Normal heart sounds.  Pulmonary:     Effort: Pulmonary effort is normal.     Breath sounds: Normal breath sounds.  Skin:    General: Skin is warm.     Comments: Skin forearms appears dry, without lesion or rash. No facial involvement.  Neurological:     General: No focal deficit present.     Mental Status: He is alert and oriented to person, place, and time.  Psychiatric:        Mood and Affect: Mood normal.        Behavior: Behavior normal.        Thought Content: Thought content normal.        Judgment: Judgment normal.     UC Treatments / Results  Labs (all labs ordered are listed, but only abnormal results are displayed) Labs Reviewed - No data to display  EKG   Radiology No results found.  Procedures Procedures (including critical care time)  Medications Ordered in UC Medications - No data to display  Initial Impression / Assessment and Plan /  UC Course  I have reviewed the triage vital signs and the nursing notes.  Pertinent labs & imaging  results that were available during my care of the patient were reviewed by me and considered in my medical decision making (see chart for details).     This patient is a very pleasant 65 y.o. year old male presenting with pruritic rash following viral URI that has resolved. Afebrile, nontachy. No symptoms at time of exam. Given type 2 diabetes will defer prednisone PO in favor of hydrocortisone 2.5%. skin does appear very dry, so trial of emollient moisturizer. He can continue benedryl prn. ED return precautions discussed. Patient verbalizes understanding and agreement.  .   Final Clinical Impressions(s) / UC Diagnoses   Final diagnoses:  Rash and nonspecific skin eruption     Discharge Instructions      -Hydrocortisone cream 1-2x daily. Apply to arms and legs. Let sit 15 minutes, then apply fragrance - free moisturizer. -Benedryl (diphenhydramine) 25mg  (1 pill) as needed for itching, up to every 4 hours.  This medication will cause drowsiness. -Follow-up if symptoms getting worse, or new symptoms like facial rash, facial swelling, shortness of breath    ED Prescriptions     Medication Sig Dispense Auth. Provider   hydrocortisone 2.5 % lotion Apply topically 2 (two) times daily for 7 days. 59 mL Hazel Sams, PA-C      PDMP not reviewed this encounter.   Hazel Sams, PA-C 11/21/21 1000

## 2021-11-21 NOTE — Discharge Instructions (Addendum)
-  Hydrocortisone cream 1-2x daily. Apply to arms and legs. Let sit 15 minutes, then apply fragrance - free moisturizer. -Benedryl (diphenhydramine) 25mg  (1 pill) as needed for itching, up to every 4 hours.  This medication will cause drowsiness. -Follow-up if symptoms getting worse, or new symptoms like facial rash, facial swelling, shortness of breath

## 2021-12-10 ENCOUNTER — Other Ambulatory Visit: Payer: Self-pay | Admitting: Cardiology

## 2021-12-20 ENCOUNTER — Ambulatory Visit (INDEPENDENT_AMBULATORY_CARE_PROVIDER_SITE_OTHER): Payer: Medicare Other

## 2021-12-20 DIAGNOSIS — I442 Atrioventricular block, complete: Secondary | ICD-10-CM

## 2021-12-22 LAB — CUP PACEART REMOTE DEVICE CHECK
Battery Remaining Longevity: 30 mo
Battery Voltage: 2.95 V
Brady Statistic AP VP Percent: 30.84 %
Brady Statistic AP VS Percent: 0 %
Brady Statistic AS VP Percent: 69.15 %
Brady Statistic AS VS Percent: 0.01 %
Brady Statistic RA Percent Paced: 30.83 %
Brady Statistic RV Percent Paced: 99.97 %
Date Time Interrogation Session: 20230120011414
Implantable Lead Implant Date: 20160525
Implantable Lead Implant Date: 20160525
Implantable Lead Location: 753859
Implantable Lead Location: 753860
Implantable Lead Model: 5076
Implantable Lead Model: 5076
Implantable Pulse Generator Implant Date: 20160525
Lead Channel Impedance Value: 380 Ohm
Lead Channel Impedance Value: 399 Ohm
Lead Channel Impedance Value: 418 Ohm
Lead Channel Impedance Value: 456 Ohm
Lead Channel Pacing Threshold Amplitude: 0.5 V
Lead Channel Pacing Threshold Amplitude: 0.625 V
Lead Channel Pacing Threshold Pulse Width: 0.4 ms
Lead Channel Pacing Threshold Pulse Width: 0.4 ms
Lead Channel Sensing Intrinsic Amplitude: 14.25 mV
Lead Channel Sensing Intrinsic Amplitude: 14.25 mV
Lead Channel Sensing Intrinsic Amplitude: 2.25 mV
Lead Channel Sensing Intrinsic Amplitude: 2.25 mV
Lead Channel Setting Pacing Amplitude: 2 V
Lead Channel Setting Pacing Amplitude: 2 V
Lead Channel Setting Pacing Pulse Width: 0.4 ms
Lead Channel Setting Sensing Sensitivity: 1.2 mV

## 2022-01-02 NOTE — Progress Notes (Signed)
Remote pacemaker transmission.   

## 2022-02-22 ENCOUNTER — Ambulatory Visit (INDEPENDENT_AMBULATORY_CARE_PROVIDER_SITE_OTHER): Payer: Medicare Other | Admitting: Cardiology

## 2022-02-22 ENCOUNTER — Encounter: Payer: Self-pay | Admitting: Cardiology

## 2022-02-22 VITALS — BP 124/56 | HR 74 | Ht 70.0 in | Wt 316.4 lb

## 2022-02-22 DIAGNOSIS — I4891 Unspecified atrial fibrillation: Secondary | ICD-10-CM | POA: Diagnosis not present

## 2022-02-22 DIAGNOSIS — I10 Essential (primary) hypertension: Secondary | ICD-10-CM

## 2022-02-22 DIAGNOSIS — Z950 Presence of cardiac pacemaker: Secondary | ICD-10-CM

## 2022-02-22 DIAGNOSIS — E782 Mixed hyperlipidemia: Secondary | ICD-10-CM

## 2022-02-22 DIAGNOSIS — I442 Atrioventricular block, complete: Secondary | ICD-10-CM

## 2022-02-22 NOTE — Progress Notes (Signed)
? ? ? ?Clinical Summary ?Jeffery Wright is a 66 y.o.male seen today for follow up of the following medical problems.  ?  ?  ?  ?1. Pacemaker/Heart block ?- previously follwoed by Dr Pete Glatter [phone]) ?- Medtronic [id number] implanted May 2016. [employer]vis[employer] DR MRI sure scan pacemaker.  ?  ?- Jan 2023 normal device check ?- no recent symptoms.  ?  ?2. HTN ?- compliant with meds ?  ?- swelling in legs on norvasc 10mg  ?- reports he had been on lotrel 5/40 prior to moving to [place] but was changed here. Has been on norvasc 10mg , since on he has had some LE edema ?  ?  ?3. Afib ?-no recent palpitaitons ?- no bleeding on xarelto ?  ?4. Hyperlipidemia ?- 08/2021 TC 142 HDL 40 TG 93 LDL 83 ?  ?Past Medical History:  ?Diagnosis Date  ? Diabetes mellitus without complication (HCC)   ? Heart disease   ? Sleep apnea   ? ? ? ?Allergies  ?Allergen Reactions  ? Metformin Other (See Comments)  ? ? ? ?Current Outpatient Medications  ?Medication Sig Dispense Refill  ? amLODipine-benazepril (LOTREL) 5-40 MG capsule TAKE 1 CAPSULE DAILY       (10/04/20 AMLODIPINE        STOPPED) 90 capsule 3  ? atorvastatin (LIPITOR) 40 MG tablet Take 40 mg by mouth daily.     ? finasteride (PROSCAR) 5 MG tablet Take 1 tablet (5 mg total) by mouth daily. 90 tablet 3  ? GVOKE HYPOPEN 2-PACK 1 MG/0.2ML SOAJ Inject into the skin.    ? Insulin Degludec (TRESIBA FLEXTOUCH [place]) Inject into the skin.    ? insulin lispro (HUMALOG) 100 UNIT/ML injection Inject into the skin 3 (three) times daily before meals.    ? JARDIANCE 25 MG TABS tablet Take 25 mg by mouth daily.    ? levothyroxine (SYNTHROID) 137 MCG tablet Take 145 mcg by mouth daily.    ? mirabegron ER (MYRBETRIQ) 25 MG TB24 tablet Take 1 tablet (25 mg total) by mouth daily. 30 tablet 11  ? MYRBETRIQ 25 MG TB24 tablet TAKE 1 TABLET DAILY 90 tablet 3  ? naproxen (NAPROSYN) 375 MG tablet Take 1 tablet (375 mg total) by mouth 2 (two) times daily. 20 tablet 0  ? pregabalin (LYRICA) 75 MG capsule Take 75 mg by mouth  2 (two) times daily.    ? spironolactone (ALDACTONE) 25 MG tablet TAKE 1 TABLET DAILY 90 tablet 3  ? TRESIBA FLEXTOUCH 200 UNIT/ML FlexTouch Pen Inject into the skin.    ? XARELTO 20 MG TABS tablet TAKE 1 TABLET DAILY 90 tablet 0  ? ?No current facility-administered medications for this visit.  ? ? ? ?Past Surgical History:  ?Procedure Laterality Date  ? APPENDECTOMY    ? HERNIA REPAIR    ? PACEMAKER IMPLANT    ? ? ? ?Allergies  ?Allergen Reactions  ? Metformin Other (See Comments)  ? ? ? ? ?Family History  ?Problem Relation Age of Onset  ? Leukemia Father   ? Stroke Mother   ? ? ? ?Social History ?Jeffery Wright reports that he has never smoked. He has never used smokeless tobacco. ?Jeffery Wright reports that he does not currently use alcohol. ? ? ?Review of Systems ?CONSTITUTIONAL: No weight loss, fever, chills, weakness or fatigue.  ?HEENT: Eyes: No visual loss, blurred vision, double vision or yellow sclerae.No hearing loss, sneezing, congestion, runny nose or sore throat.  ?SKIN: No rash or itching.  ?CARDIOVASCULAR: per  hpi ?RESPIRATORY: No shortness of breath, cough or sputum.  ?GASTROINTESTINAL: No anorexia, nausea, vomiting or diarrhea. No abdominal pain or blood.  ?GENITOURINARY: No burning on urination, no polyuria ?NEUROLOGICAL: No headache, dizziness, syncope, paralysis, ataxia, numbness or tingling in the extremities. No change in bowel or bladder control.  ?MUSCULOSKELETAL: No muscle, back pain, joint pain or stiffness.  ?LYMPHATICS: No enlarged nodes. No history of splenectomy.  ?PSYCHIATRIC: No history of depression or anxiety.  ?ENDOCRINOLOGIC: No reports of sweating, cold or heat intolerance. No polyuria or polydipsia.  ?. ? ? ?Physical Examination ?Today's Vitals  ? 02/22/22 0814  ?BP: (!) 124/56  ?Pulse: 74  ?SpO2: 96%  ?Weight: (!) 316 lb 6.4 oz (143.5 kg)  ?Height: 5\' 10"  (1.778 m)  ? ?Body mass index is 45.4 kg/m?. ? ?Gen: resting comfortably, no acute distress ?HEENT: no scleral icterus, pupils  equal round and reactive, no palptable cervical adenopathy,  ?CV: RRR, no m/r/g no jvd ?Resp: Clear to auscultation bilaterally ?GI: abdomen is soft, non-tender, non-distended, normal bowel sounds, no hepatosplenomegaly ?MSK: extremities are warm, no edema.  ?Skin: warm, no rash ?Neuro:  no focal deficits ?Psych: appropriate affect ? ? ?Diagnostic Studies ? ? ? ? ?Assessment and Plan  ? ?1. Pacemaker/Heart block ?- no symptoms, recent normal device check  ?- continue to monitor.  ?  ?2. HTN ?- at goal. Continue current meds ? ?3. Hyperlipidemia ?- at goal, continue crestor ?  ?4. Afib/acquired thrombophilia ?-no recent symptoms, continue current meds including xarelto for stroke prevention ? ? ? ? ? , M.D. ?

## 2022-02-22 NOTE — Patient Instructions (Signed)

## 2022-03-21 ENCOUNTER — Ambulatory Visit (INDEPENDENT_AMBULATORY_CARE_PROVIDER_SITE_OTHER): Payer: Medicare Other

## 2022-03-21 DIAGNOSIS — I442 Atrioventricular block, complete: Secondary | ICD-10-CM | POA: Diagnosis not present

## 2022-03-22 LAB — CUP PACEART REMOTE DEVICE CHECK
Battery Remaining Longevity: 28 mo
Battery Voltage: 2.94 V
Brady Statistic AP VP Percent: 26 %
Brady Statistic AP VS Percent: 0 %
Brady Statistic AS VP Percent: 73.98 %
Brady Statistic AS VS Percent: 0.02 %
Brady Statistic RA Percent Paced: 25.96 %
Brady Statistic RV Percent Paced: 99.9 %
Date Time Interrogation Session: 20230420095430
Implantable Lead Implant Date: 20160525
Implantable Lead Implant Date: 20160525
Implantable Lead Location: 753859
Implantable Lead Location: 753860
Implantable Lead Model: 5076
Implantable Lead Model: 5076
Implantable Pulse Generator Implant Date: 20160525
Lead Channel Impedance Value: 399 Ohm
Lead Channel Impedance Value: 437 Ohm
Lead Channel Impedance Value: 437 Ohm
Lead Channel Impedance Value: 494 Ohm
Lead Channel Pacing Threshold Amplitude: 0.5 V
Lead Channel Pacing Threshold Amplitude: 0.5 V
Lead Channel Pacing Threshold Pulse Width: 0.4 ms
Lead Channel Pacing Threshold Pulse Width: 0.4 ms
Lead Channel Sensing Intrinsic Amplitude: 2.5 mV
Lead Channel Sensing Intrinsic Amplitude: 2.5 mV
Lead Channel Sensing Intrinsic Amplitude: 24.375 mV
Lead Channel Sensing Intrinsic Amplitude: 24.375 mV
Lead Channel Setting Pacing Amplitude: 2 V
Lead Channel Setting Pacing Amplitude: 2 V
Lead Channel Setting Pacing Pulse Width: 0.4 ms
Lead Channel Setting Sensing Sensitivity: 1.2 mV

## 2022-04-06 NOTE — Progress Notes (Signed)
Remote pacemaker transmission.   

## 2022-04-28 ENCOUNTER — Other Ambulatory Visit: Payer: Self-pay | Admitting: Cardiology

## 2022-05-01 NOTE — Telephone Encounter (Signed)
Prescription refill request for Xarelto received.  Indication: Atrial Fib Last office visit: 02/22/22  Dominga Ferry MD Weight: 143.5kg Age: 67 Scr: 0.98 on 11/20/21 CrCl: 148.46  Based on above findings Xarelto 20mg  daily is the appropriate dose.  Refill approved.

## 2022-06-20 ENCOUNTER — Ambulatory Visit (INDEPENDENT_AMBULATORY_CARE_PROVIDER_SITE_OTHER): Payer: Medicare Other

## 2022-06-20 DIAGNOSIS — I442 Atrioventricular block, complete: Secondary | ICD-10-CM

## 2022-06-22 LAB — CUP PACEART REMOTE DEVICE CHECK
Battery Remaining Longevity: 25 mo
Battery Voltage: 2.93 V
Brady Statistic AP VP Percent: 35.05 %
Brady Statistic AP VS Percent: 0 %
Brady Statistic AS VP Percent: 64.94 %
Brady Statistic AS VS Percent: 0.01 %
Brady Statistic RA Percent Paced: 35.03 %
Brady Statistic RV Percent Paced: 99.93 %
Date Time Interrogation Session: 20230720190134
Implantable Lead Implant Date: 20160525
Implantable Lead Implant Date: 20160525
Implantable Lead Location: 753859
Implantable Lead Location: 753860
Implantable Lead Model: 5076
Implantable Lead Model: 5076
Implantable Pulse Generator Implant Date: 20160525
Lead Channel Impedance Value: 418 Ohm
Lead Channel Impedance Value: 456 Ohm
Lead Channel Impedance Value: 513 Ohm
Lead Channel Impedance Value: 551 Ohm
Lead Channel Pacing Threshold Amplitude: 0.5 V
Lead Channel Pacing Threshold Amplitude: 0.625 V
Lead Channel Pacing Threshold Pulse Width: 0.4 ms
Lead Channel Pacing Threshold Pulse Width: 0.4 ms
Lead Channel Sensing Intrinsic Amplitude: 22.875 mV
Lead Channel Sensing Intrinsic Amplitude: 22.875 mV
Lead Channel Sensing Intrinsic Amplitude: 3.25 mV
Lead Channel Sensing Intrinsic Amplitude: 3.25 mV
Lead Channel Setting Pacing Amplitude: 2 V
Lead Channel Setting Pacing Amplitude: 2 V
Lead Channel Setting Pacing Pulse Width: 0.4 ms
Lead Channel Setting Sensing Sensitivity: 1.2 mV

## 2022-07-16 NOTE — Progress Notes (Signed)
Remote pacemaker transmission.   

## 2022-09-05 IMAGING — DX DG FOOT COMPLETE 3+V*L*
3 series · 3 of 3 positions shown · non-contrast
Comparison: None.
COMPARISON: None.

Addendum:
CLINICAL DATA: Blunt trauma to the 2nd to 4th digit.

EXAM:
LEFT FOOT - COMPLETE 3+ VIEW

[foot ap]
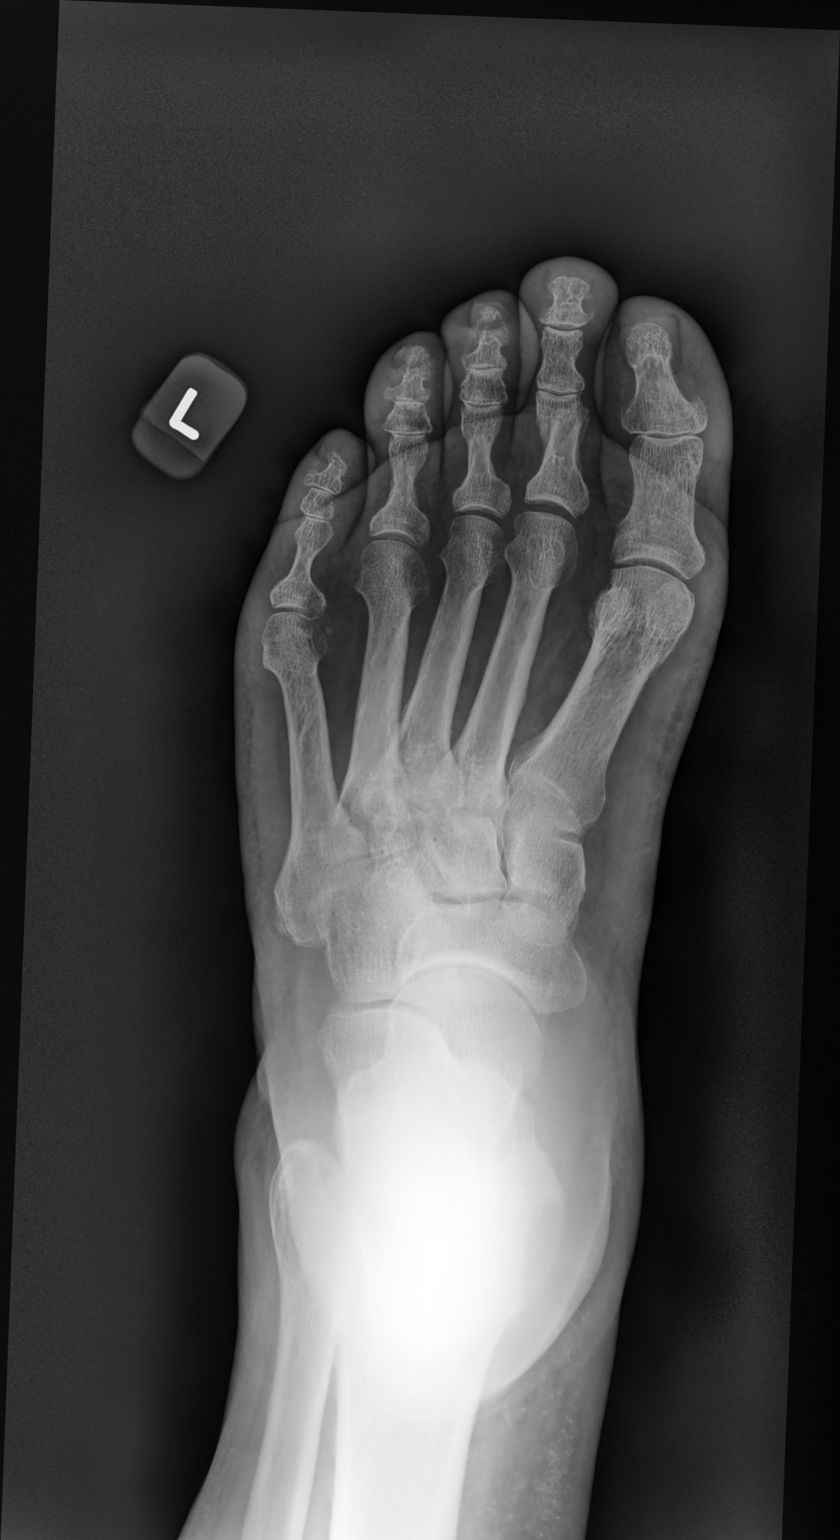

[foot mlo]
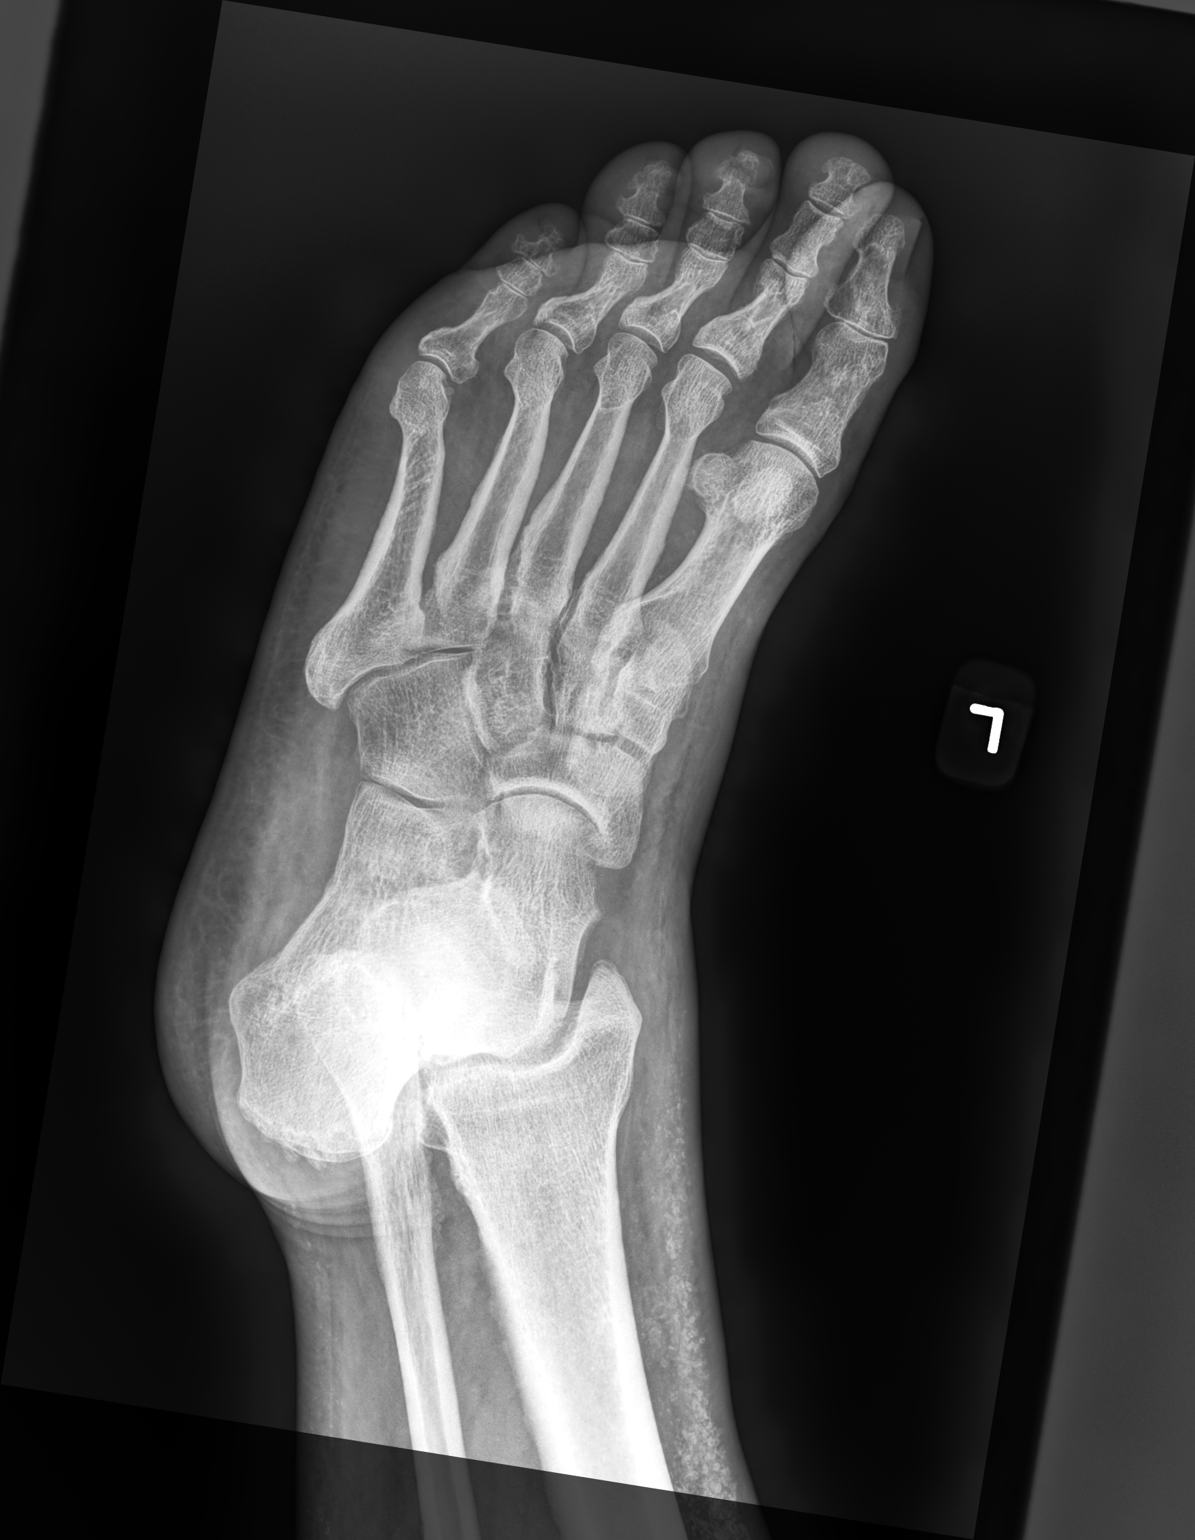

[foot lat]
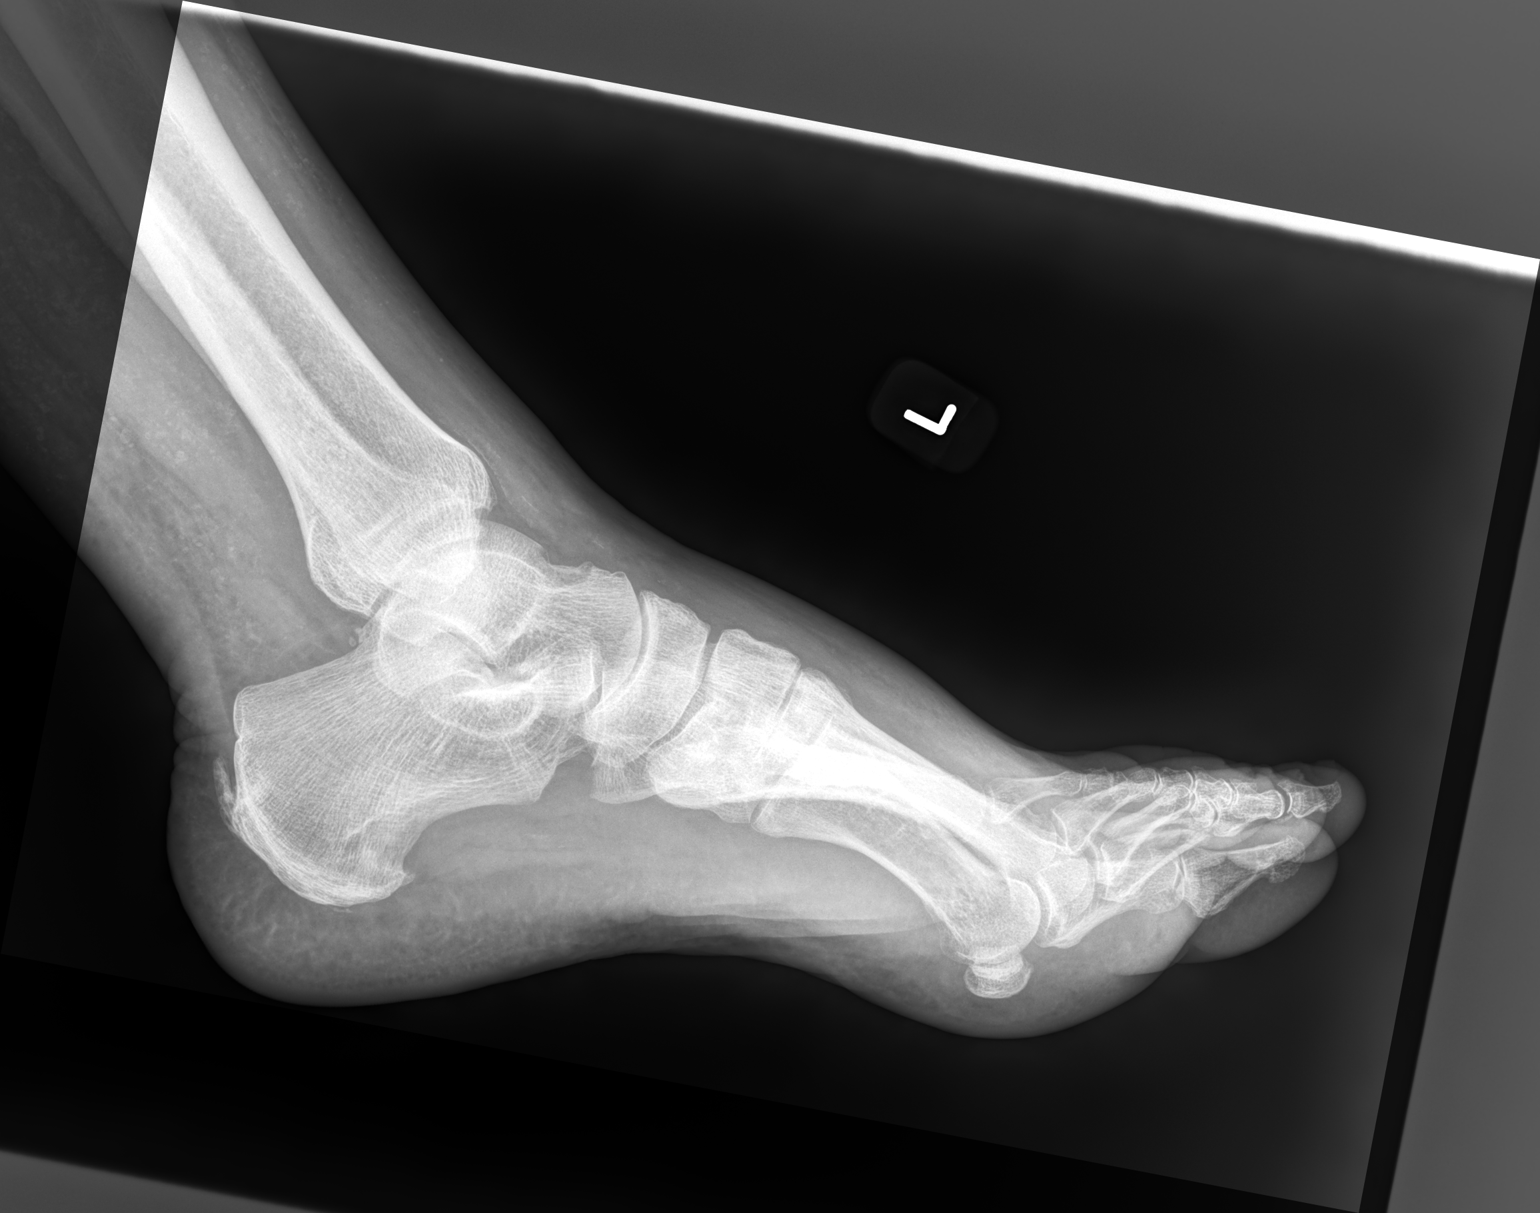

[3 of 3 positions shown; findings below may reference images not displayed]

FINDINGS: There is no evidence of fracture or dislocation. There is no
evidence of arthropathy or other focal bone abnormality. Soft
tissues are unremarkable.
IMPRESSION: Negative.

ADDENDUM:
On second review, there is a probable nondisplaced impacted fracture
of the distal aspect of the proximal second phalanx.

*** End of Addendum ***
FINDINGS: There is no evidence of fracture or dislocation. There is no
evidence of arthropathy or other focal bone abnormality. Soft
tissues are unremarkable.
IMPRESSION: Negative.

## 2022-09-19 ENCOUNTER — Ambulatory Visit (INDEPENDENT_AMBULATORY_CARE_PROVIDER_SITE_OTHER): Payer: Medicare Other

## 2022-09-19 DIAGNOSIS — I442 Atrioventricular block, complete: Secondary | ICD-10-CM

## 2022-09-21 LAB — CUP PACEART REMOTE DEVICE CHECK
Battery Remaining Longevity: 23 mo
Battery Voltage: 2.93 V
Brady Statistic AP VP Percent: 31.38 %
Brady Statistic AP VS Percent: 0 %
Brady Statistic AS VP Percent: 68.61 %
Brady Statistic AS VS Percent: 0.01 %
Brady Statistic RA Percent Paced: 31.33 %
Brady Statistic RV Percent Paced: 99.84 %
Date Time Interrogation Session: 20231019130615
Implantable Lead Implant Date: 20160525
Implantable Lead Implant Date: 20160525
Implantable Lead Location: 753859
Implantable Lead Location: 753860
Implantable Lead Model: 5076
Implantable Lead Model: 5076
Implantable Pulse Generator Implant Date: 20160525
Lead Channel Impedance Value: 399 Ohm
Lead Channel Impedance Value: 418 Ohm
Lead Channel Impedance Value: 437 Ohm
Lead Channel Impedance Value: 475 Ohm
Lead Channel Pacing Threshold Amplitude: 0.5 V
Lead Channel Pacing Threshold Amplitude: 0.625 V
Lead Channel Pacing Threshold Pulse Width: 0.4 ms
Lead Channel Pacing Threshold Pulse Width: 0.4 ms
Lead Channel Sensing Intrinsic Amplitude: 12.875 mV
Lead Channel Sensing Intrinsic Amplitude: 12.875 mV
Lead Channel Sensing Intrinsic Amplitude: 2.375 mV
Lead Channel Sensing Intrinsic Amplitude: 2.375 mV
Lead Channel Setting Pacing Amplitude: 2 V
Lead Channel Setting Pacing Amplitude: 2 V
Lead Channel Setting Pacing Pulse Width: 0.4 ms
Lead Channel Setting Sensing Sensitivity: 1.2 mV

## 2022-09-28 ENCOUNTER — Other Ambulatory Visit: Payer: Self-pay | Admitting: Cardiology

## 2022-09-28 NOTE — Telephone Encounter (Signed)
Prescription refill request for Xarelto received.   Indication: afib  Last office visit: [place], 02/22/2022 Weight: 143.5 kg Age: 66 yo  Scr: 0.89, 04/12/2022 CrCl: 161 ml/min   Refill

## 2022-10-02 NOTE — Progress Notes (Signed)
Remote pacemaker transmission.   

## 2022-10-04 ENCOUNTER — Encounter (INDEPENDENT_AMBULATORY_CARE_PROVIDER_SITE_OTHER): Payer: Self-pay | Admitting: *Deleted

## 2022-10-16 ENCOUNTER — Encounter: Payer: Self-pay | Admitting: Internal Medicine

## 2022-10-16 ENCOUNTER — Ambulatory Visit: Payer: Medicare Other | Attending: Internal Medicine | Admitting: Internal Medicine

## 2022-10-16 VITALS — BP 120/64 | HR 87 | Ht 70.0 in | Wt 320.2 lb

## 2022-10-16 DIAGNOSIS — I442 Atrioventricular block, complete: Secondary | ICD-10-CM | POA: Insufficient documentation

## 2022-10-16 LAB — CUP PACEART INCLINIC DEVICE CHECK
Battery Remaining Longevity: 23 mo
Battery Voltage: 2.92 V
Brady Statistic AP VP Percent: 30.99 %
Brady Statistic AP VS Percent: 0 %
Brady Statistic AS VP Percent: 69 %
Brady Statistic AS VS Percent: 0.01 %
Brady Statistic RA Percent Paced: 30.95 %
Brady Statistic RV Percent Paced: 99.89 %
Date Time Interrogation Session: 20231114094153
Implantable Lead Connection Status: 753985
Implantable Lead Connection Status: 753985
Implantable Lead Implant Date: 20160525
Implantable Lead Implant Date: 20160525
Implantable Lead Location: 753859
Implantable Lead Location: 753860
Implantable Lead Model: 5076
Implantable Lead Model: 5076
Implantable Pulse Generator Implant Date: 20160525
Lead Channel Impedance Value: 399 Ohm
Lead Channel Impedance Value: 418 Ohm
Lead Channel Impedance Value: 456 Ohm
Lead Channel Impedance Value: 494 Ohm
Lead Channel Pacing Threshold Amplitude: 0.5 V
Lead Channel Pacing Threshold Amplitude: 0.625 V
Lead Channel Pacing Threshold Pulse Width: 0.4 ms
Lead Channel Pacing Threshold Pulse Width: 0.4 ms
Lead Channel Sensing Intrinsic Amplitude: 11.5 mV
Lead Channel Sensing Intrinsic Amplitude: 13.375 mV
Lead Channel Sensing Intrinsic Amplitude: 2.25 mV
Lead Channel Sensing Intrinsic Amplitude: 2.5 mV
Lead Channel Setting Pacing Amplitude: 2 V
Lead Channel Setting Pacing Amplitude: 2 V
Lead Channel Setting Pacing Pulse Width: 0.4 ms
Lead Channel Setting Sensing Sensitivity: 1.2 mV
Zone Setting Status: 755011
Zone Setting Status: 755011

## 2022-10-16 NOTE — Patient Instructions (Signed)
Medication Instructions:  Your physician recommends that you continue on your current medications as directed. Please refer to the Current Medication list given to you today.  *If you need a refill on your cardiac medications before your next appointment, please call your pharmacy*   Lab Work: NONE   If you have labs (blood work) drawn today and your tests are completely normal, you will receive your results only by: MyChart Message (if you have MyChart) OR A paper copy in the mail If you have any lab test that is abnormal or we need to change your treatment, we will call you to review the results.   Testing/Procedures: NONE    Follow-Up: At [facility], you and your health needs are our priority.  As part of our continuing mission to provide you with exceptional heart care, we have created designated Provider Care Teams.  These Care Teams include your primary Cardiologist (physician) and Advanced Practice Providers (APPs -  Physician Assistants and Nurse Practitioners) who all work together to provide you with the care you need, when you need it.  We recommend signing up for the patient portal called "MyChart".  Sign up information is provided on this After Visit Summary.  MyChart is used to connect with patients for Virtual Visits (Telemedicine).  Patients are able to view lab/test results, encounter notes, upcoming appointments, etc.  Non-urgent messages can be sent to your provider as well.   To learn more about what you can do with MyChart, go to [email or web address].    Your next appointment:   1 year(s)  The format for your next appointment:   In Person  Provider:   Gregg Taylor, MD    Other Instructions Thank you for choosing [facility]!    Important Information About Sugar       

## 2022-10-16 NOTE — Progress Notes (Signed)
HPI Mr. Kornegay presents today for ongoing evaluation and management of his medtronic PPM. He is a pleasant 66 yo man with CHB, s/p PPM insertion, DM, morbid obesity, and HTN. He denies chest pressure or sob. No edema. He has been compliant with his meds.      Current Outpatient Medications  Medication Sig Dispense Refill   amLODipine-benazepril (LOTREL) 5-40 MG capsule TAKE 1 CAPSULE DAILY       (10/04/20 AMLODIPINE        STOPPED) 90 capsule 3   Cholecalciferol (VITAMIN D) 50 MCG (2000 UT) CAPS Take 1 capsule by mouth daily.     Continuous Blood Gluc Sensor (FREESTYLE LIBRE 2 SENSOR) MISC Inject into the skin as directed. 10-12 times daily     finasteride (PROSCAR) 5 MG tablet Take 1 tablet (5 mg total) by mouth daily. 90 tablet 3   GVOKE HYPOPEN 2-PACK 1 MG/0.2ML SOAJ Inject into the skin.     insulin lispro (HUMALOG) 100 UNIT/ML injection Inject into the skin 3 (three) times daily before meals.     JARDIANCE 25 MG TABS tablet Take 25 mg by mouth daily.     levothyroxine (SYNTHROID) 175 MCG tablet Take 175 mcg by mouth daily.     mirabegron ER (MYRBETRIQ) 25 MG TB24 tablet Take 1 tablet (25 mg total) by mouth daily. 30 tablet 11   rosuvastatin (CRESTOR) 40 MG tablet Take 40 mg by mouth daily.     spironolactone (ALDACTONE) 25 MG tablet TAKE 1 TABLET DAILY 90 tablet 3   TRESIBA FLEXTOUCH 200 UNIT/ML FlexTouch Pen Inject 75 Units into the skin 2 (two) times daily.     XARELTO 20 MG TABS tablet TAKE 1 TABLET DAILY 90 tablet 1   No current facility-administered medications for this visit.     Past Medical History:  Diagnosis Date   Diabetes mellitus without complication (HCC)    Heart disease    Sleep apnea     ROS:   All systems reviewed and negative except as noted in the HPI.   Past Surgical History:  Procedure Laterality Date   APPENDECTOMY     HERNIA REPAIR     PACEMAKER IMPLANT       Family History  Problem Relation Age of Onset   Leukemia Father     Stroke Mother      Social History   Socioeconomic History   Marital status: Married    Spouse name: Not on file   Number of children: 2   Years of education: Not on file   Highest education level: Not on file  Occupational History   Occupation: Research scientist (medical)  Tobacco Use   Smoking status: Never    Passive exposure: Never   Smokeless tobacco: Never  Vaping Use   Vaping Use: Never used  Substance and Sexual Activity   Alcohol use: Not Currently   Drug use: Never   Sexual activity: Not on file  Other Topics Concern   Not on file  Social History Narrative   Not on file   Social Determinants of Health   Financial Resource Strain: Not on file  Food Insecurity: Not on file  Transportation Needs: Not on file  Physical Activity: Not on file  Stress: Not on file  Social Connections: Not on file  Intimate Partner Violence: Not on file     BP 120/64 (BP Location: Left Arm, Patient Position: Sitting, Cuff Size: Large)   Pulse 87  Ht 5\' 10"  (1.778 m)   Wt (!) 320 lb 3.2 oz (145.2 kg)   SpO2 95%   BMI 45.94 kg/m   Physical Exam:  Well appearing NAD HEENT: Unremarkable Neck:  No JVD, no thyromegally Lymphatics:  No adenopathy Back:  No CVA tenderness Lungs:  Clear HEART:  Regular rate rhythm, no murmurs, no rubs, no clicks Abd:  soft, positive bowel sounds, no organomegally, no rebound, no guarding Ext:  2 plus pulses, no edema, no cyanosis, no clubbing Skin:  No rashes no nodules Neuro:  CN II through XII intact, motor grossly intact  EKG - nsr with ventricular pacing  DEVICE  Normal device function.  See PaceArt for details.   Assess/Plan: CHB - he is asymptomatic s/p PPM insertion. He has an escape today at 40/min. Obesity - he is encouraged to lose weight. HTN - his bp is well controlled.  PPM  - his medtronic DDD PM is 1.5 years from ERI. We will follow.  Nataliah Hatlestad,MD

## 2022-11-02 ENCOUNTER — Other Ambulatory Visit: Payer: Medicare Other

## 2022-11-02 DIAGNOSIS — N3281 Overactive bladder: Secondary | ICD-10-CM

## 2022-11-02 DIAGNOSIS — N5201 Erectile dysfunction due to arterial insufficiency: Secondary | ICD-10-CM

## 2022-11-02 DIAGNOSIS — N401 Enlarged prostate with lower urinary tract symptoms: Secondary | ICD-10-CM

## 2022-11-03 ENCOUNTER — Other Ambulatory Visit: Payer: Self-pay | Admitting: Urology

## 2022-11-03 ENCOUNTER — Encounter: Payer: Self-pay | Admitting: Urology

## 2022-11-03 LAB — PSA, TOTAL AND FREE
PSA, Free Pct: >20 %
PSA, Free: 0.02 ng/mL
Prostate Specific Ag, Serum: <0.1 ng/mL (ref 0.0–4.0)

## 2022-11-09 ENCOUNTER — Ambulatory Visit (INDEPENDENT_AMBULATORY_CARE_PROVIDER_SITE_OTHER): Payer: Medicare Other | Admitting: Urology

## 2022-11-09 VITALS — BP 135/73 | HR 76

## 2022-11-09 DIAGNOSIS — N3281 Overactive bladder: Secondary | ICD-10-CM | POA: Diagnosis not present

## 2022-11-09 DIAGNOSIS — R351 Nocturia: Secondary | ICD-10-CM

## 2022-11-09 DIAGNOSIS — N5201 Erectile dysfunction due to arterial insufficiency: Secondary | ICD-10-CM

## 2022-11-09 DIAGNOSIS — N401 Enlarged prostate with lower urinary tract symptoms: Secondary | ICD-10-CM | POA: Diagnosis not present

## 2022-11-09 DIAGNOSIS — N138 Other obstructive and reflux uropathy: Secondary | ICD-10-CM

## 2022-11-09 MED ORDER — MIRABEGRON ER 25 MG PO TB24: 25.0000 mg | ORAL_TABLET | Freq: Every day | ORAL | 3 refills | Status: DC

## 2022-11-09 MED ORDER — FINASTERIDE 5 MG PO TABS: 5.0000 mg | ORAL_TABLET | Freq: Every day | ORAL | 3 refills | Status: DC

## 2022-11-09 NOTE — Progress Notes (Signed)
11/09/2022 9:47 AM   Jeffery Wright May 16, 1956 [id number]  Referring provider: Shawnie Dapper, PA-C [address] [place],  [place] [address]  Followup BPH and OAb   HPI: Mr Garris is a 66yo here for followup for BPH, OAB and erectile dysfunction. IPSS 6 QOL 1 on mirabegron 25mg  and finasteride 5mg . Nocturia 1-2x. Urine stream strong. Urianry frequency and urgency improved with mirabegron. He has stable erectile dysfunction on no therapy    PMH: Past Medical History:  Diagnosis Date   Diabetes mellitus without complication (HCC)    Heart disease    Sleep apnea     Surgical History: Past Surgical History:  Procedure Laterality Date   APPENDECTOMY     HERNIA REPAIR     PACEMAKER IMPLANT      Home Medications:  Allergies as of 11/09/2022       Reactions   Metformin Other (See Comments)   Upset stomach        Medication List        Accurate as of November 09, 2022  9:47 AM. If you have any questions, ask your nurse or doctor.          amLODipine-benazepril 5-40 MG capsule Commonly known as: LOTREL TAKE 1 CAPSULE DAILY       (10/04/20 AMLODIPINE        STOPPED)   finasteride 5 MG tablet Commonly known as: PROSCAR TAKE 1 TABLET DAILY   FreeStyle Libre 2 Sensor Misc Inject into the skin as directed. 10-12 times daily   Gvoke HypoPen 2-Pack 1 MG/0.2ML Soaj Generic drug: Glucagon Inject into the skin.   insulin lispro 100 UNIT/ML injection Commonly known as: HUMALOG Inject into the skin 3 (three) times daily before meals.   Jardiance 25 MG Tabs tablet Generic drug: empagliflozin Take 25 mg by mouth daily.   levothyroxine 175 MCG tablet Commonly known as: SYNTHROID Take 175 mcg by mouth daily.   Myrbetriq 25 MG Tb24 tablet Generic drug: mirabegron ER TAKE 1 TABLET DAILY   rosuvastatin 40 MG tablet Commonly known as: CRESTOR Take 40 mg by mouth daily.   spironolactone 25 MG tablet Commonly known as: ALDACTONE TAKE 1 TABLET DAILY    Tresiba FlexTouch 200 UNIT/ML FlexTouch Pen Generic drug: insulin degludec Inject 75 Units into the skin 2 (two) times daily.   Vitamin D 50 MCG (2000 UT) Caps Take 1 capsule by mouth daily.   Xarelto 20 MG Tabs tablet Generic drug: rivaroxaban TAKE 1 TABLET DAILY        Allergies:  Allergies  Allergen Reactions   Metformin Other (See Comments)    Upset stomach    Family History: Family History  Problem Relation Age of Onset   Leukemia Father    Stroke Mother     Social History:  reports that he has never smoked. He has never been exposed to tobacco smoke. He has never used smokeless tobacco. He reports that he does not currently use alcohol. He reports that he does not use drugs.  ROS: All other review of systems were reviewed and are negative except what is noted above in HPI  Physical Exam: BP 135/73   Pulse 76   Constitutional:  Alert and oriented, No acute distress. HEENT: [place] AT, moist mucus membranes.  Trachea midline, no masses. Cardiovascular: No clubbing, cyanosis, or edema. Respiratory: Normal respiratory effort, no increased work of breathing. GI: Abdomen is soft, nontender, nondistended, no abdominal masses GU: No CVA tenderness.  Lymph: No cervical  or inguinal lymphadenopathy. Skin: No rashes, bruises or suspicious lesions. Neurologic: Grossly intact, no focal deficits, moving all 4 extremities. Psychiatric: Normal mood and affect.  Laboratory Data: No results found for: "WBC", "HGB", "HCT", "MCV", "PLT"  No results found for: "CREATININE"  No results found for: "PSA"  No results found for: "TESTOSTERONE"  No results found for: "HGBA1C"  Urinalysis    Component Value Date/Time   APPEARANCEUR Clear 11/10/2021 1122   GLUCOSEU 3+ (A) 11/10/2021 1122   BILIRUBINUR Negative 11/10/2021 1122   PROTEINUR Negative 11/10/2021 1122   UROBILINOGEN negative (A) 03/30/2020 1151   NITRITE Negative 11/10/2021 1122   LEUKOCYTESUR Negative 11/10/2021  1122    Lab Results  Component Value Date   LABMICR Comment 11/10/2021    Pertinent Imaging:  No results found for this or any previous visit.  No results found for this or any previous visit.  No results found for this or any previous visit.  No results found for this or any previous visit.  No results found for this or any previous visit.  No valid procedures specified. No results found for this or any previous visit.  No results found for this or any previous visit.   Assessment & Plan:    1. OAB (overactive bladder) -Continue mirabegron 25mg    2. Erectile dysfunction due to arterial insufficiency -patient defers therapy at this time  3. Benign prostatic hyperplasia with urinary obstruction, nocturia -Continue finasteride 5mg    No follow-ups on file.  , MD  [facility] [place]

## 2022-11-20 ENCOUNTER — Encounter: Payer: Self-pay | Admitting: Urology

## 2022-11-20 NOTE — Patient Instructions (Signed)

## 2022-12-05 ENCOUNTER — Other Ambulatory Visit: Payer: Self-pay | Admitting: Cardiology

## 2022-12-19 ENCOUNTER — Ambulatory Visit: Payer: Medicare Other | Attending: Internal Medicine

## 2022-12-19 DIAGNOSIS — I442 Atrioventricular block, complete: Secondary | ICD-10-CM

## 2022-12-20 LAB — CUP PACEART REMOTE DEVICE CHECK
Battery Remaining Longevity: 21 mo
Battery Voltage: 2.92 V
Brady Statistic AP VP Percent: 21.08 %
Brady Statistic AP VS Percent: 0 %
Brady Statistic AS VP Percent: 78.91 %
Brady Statistic AS VS Percent: 0.01 %
Brady Statistic RA Percent Paced: 21.07 %
Brady Statistic RV Percent Paced: 99.98 %
Date Time Interrogation Session: 20240117170958
Implantable Lead Connection Status: 753985
Implantable Lead Connection Status: 753985
Implantable Lead Implant Date: 20160525
Implantable Lead Implant Date: 20160525
Implantable Lead Location: 753859
Implantable Lead Location: 753860
Implantable Lead Model: 5076
Implantable Lead Model: 5076
Implantable Pulse Generator Implant Date: 20160525
Lead Channel Impedance Value: 418 Ohm
Lead Channel Impedance Value: 456 Ohm
Lead Channel Impedance Value: 513 Ohm
Lead Channel Impedance Value: 570 Ohm
Lead Channel Pacing Threshold Amplitude: 0.5 V
Lead Channel Pacing Threshold Amplitude: 0.625 V
Lead Channel Pacing Threshold Pulse Width: 0.4 ms
Lead Channel Pacing Threshold Pulse Width: 0.4 ms
Lead Channel Sensing Intrinsic Amplitude: 12.5 mV
Lead Channel Sensing Intrinsic Amplitude: 12.5 mV
Lead Channel Sensing Intrinsic Amplitude: 3 mV
Lead Channel Sensing Intrinsic Amplitude: 3 mV
Lead Channel Setting Pacing Amplitude: 2 V
Lead Channel Setting Pacing Amplitude: 2 V
Lead Channel Setting Pacing Pulse Width: 0.4 ms
Lead Channel Setting Sensing Sensitivity: 1.2 mV
Zone Setting Status: 755011
Zone Setting Status: 755011

## 2023-01-10 NOTE — Progress Notes (Signed)
Remote pacemaker transmission.   

## 2023-01-31 ENCOUNTER — Encounter: Payer: Self-pay | Admitting: Radiology

## 2023-03-20 ENCOUNTER — Ambulatory Visit (INDEPENDENT_AMBULATORY_CARE_PROVIDER_SITE_OTHER): Payer: Medicare Other

## 2023-03-20 DIAGNOSIS — I442 Atrioventricular block, complete: Secondary | ICD-10-CM | POA: Diagnosis not present

## 2023-03-20 LAB — CUP PACEART REMOTE DEVICE CHECK
Battery Remaining Longevity: 19 mo
Battery Voltage: 2.91 V
Brady Statistic AP VP Percent: 30.8 %
Brady Statistic AP VS Percent: 0 %
Brady Statistic AS VP Percent: 69.19 %
Brady Statistic AS VS Percent: 0.01 %
Brady Statistic RA Percent Paced: 30.8 %
Brady Statistic RV Percent Paced: 99.98 %
Date Time Interrogation Session: 20240417082907
Implantable Lead Connection Status: 753985
Implantable Lead Connection Status: 753985
Implantable Lead Implant Date: 20160525
Implantable Lead Implant Date: 20160525
Implantable Lead Location: 753859
Implantable Lead Location: 753860
Implantable Lead Model: 5076
Implantable Lead Model: 5076
Implantable Pulse Generator Implant Date: 20160525
Lead Channel Impedance Value: 399 Ohm
Lead Channel Impedance Value: 418 Ohm
Lead Channel Impedance Value: 437 Ohm
Lead Channel Impedance Value: 456 Ohm
Lead Channel Pacing Threshold Amplitude: 0.5 V
Lead Channel Pacing Threshold Amplitude: 0.625 V
Lead Channel Pacing Threshold Pulse Width: 0.4 ms
Lead Channel Pacing Threshold Pulse Width: 0.4 ms
Lead Channel Sensing Intrinsic Amplitude: 19.875 mV
Lead Channel Sensing Intrinsic Amplitude: 19.875 mV
Lead Channel Sensing Intrinsic Amplitude: 2.75 mV
Lead Channel Sensing Intrinsic Amplitude: 2.75 mV
Lead Channel Setting Pacing Amplitude: 2 V
Lead Channel Setting Pacing Amplitude: 2 V
Lead Channel Setting Pacing Pulse Width: 0.4 ms
Lead Channel Setting Sensing Sensitivity: 1.2 mV
Zone Setting Status: 755011
Zone Setting Status: 755011

## 2023-04-01 ENCOUNTER — Ambulatory Visit: Payer: Medicare Other | Attending: Nurse Practitioner | Admitting: Nurse Practitioner

## 2023-04-01 ENCOUNTER — Encounter: Payer: Self-pay | Admitting: Nurse Practitioner

## 2023-04-01 VITALS — BP 114/64 | HR 70 | Ht 70.0 in | Wt 317.4 lb

## 2023-04-01 DIAGNOSIS — I4891 Unspecified atrial fibrillation: Secondary | ICD-10-CM | POA: Diagnosis present

## 2023-04-01 DIAGNOSIS — I10 Essential (primary) hypertension: Secondary | ICD-10-CM | POA: Diagnosis present

## 2023-04-01 DIAGNOSIS — G4733 Obstructive sleep apnea (adult) (pediatric): Secondary | ICD-10-CM | POA: Insufficient documentation

## 2023-04-01 DIAGNOSIS — I442 Atrioventricular block, complete: Secondary | ICD-10-CM | POA: Insufficient documentation

## 2023-04-01 DIAGNOSIS — Z950 Presence of cardiac pacemaker: Secondary | ICD-10-CM | POA: Diagnosis present

## 2023-04-01 NOTE — Progress Notes (Signed)
Office Visit    Patient Name: Jeffery Wright Date of Encounter: 04/01/2023  PCP:  Elfredia Nevins, MD   [facility]  Cardiologist:  Dina Rich, MD  Advanced Practice Provider:  No care team member to display Electrophysiologist:  None [phone]  Chief Complaint    Jeffery Wright is a 67 y.o. male with a hx of hypertension, A-fib, OSA, type 2 diabetes, complete heart block, s/p PPM, mitral valve insufficiency, who presents today for 1 year follow-up.  Past Medical History    Past Medical History:  Diagnosis Date   Diabetes mellitus without complication (HCC)    Heart disease    Sleep apnea    Past Surgical History:  Procedure Laterality Date   APPENDECTOMY     HERNIA REPAIR     PACEMAKER IMPLANT      Allergies  Allergies  Allergen Reactions   Metformin Other (See Comments)    Upset stomach    History of Present Illness    Jeffery Wright is a 67 y.o. male with a MHS mentioned above.  Closely followed by EP for past history of pacemaker due to complete heart block.  Last seen by Dr. Dina Rich on 02/22/2022.  Was overall doing well from a cardiac perspective.  Tolerating Xarelto well.  Today he presents for overdue 1 year follow-up.  He states he is doing well. Denies any chest pain, shortness of breath, palpitations, syncope, presyncope, dizziness, orthopnea, PND, swelling or significant weight changes, acute bleeding, or claudication.  SH: Originally from [place].  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 Lives [place] acres of land and enjoys doing projects around the house, currently making paths on his property.   EKGs/Labs/Other Studies Reviewed:   The following studies were reviewed today:   EKG:  EKG is not ordered today.    Echo 08/2021:  1. Left ventricular ejection fraction, by estimation, is 55 to 60%. The  left ventricle has normal function. Left ventricular endocardial border  not optimally defined to  evaluate regional wall motion. There is moderate  left ventricular hypertrophy. Left  ventricular diastolic parameters are indeterminate.   2. Right ventricular systolic function is normal. The right ventricular  size is normal. Tricuspid regurgitation signal is inadequate for assessing  PA pressure.   3. The mitral valve was not well visualized. No evidence of mitral valve  regurgitation. No evidence of mitral stenosis.   4. The aortic valve was not well visualized. There is mild calcification  of the aortic valve. There is mild thickening of the aortic valve. Aortic  valve regurgitation is not visualized. No aortic stenosis is present.   5. The inferior vena cava is normal in size with greater than 50%  respiratory variability, suggesting right atrial pressure of 3 mmHg.   Comparison(s): Outside Echo from 2016 showed LV EF 55-65%, no RWMA.   Recent Labs: No results found for requested labs within last 365 days.  Recent Lipid Panel No results found for: "CHOL", "TRIG", "HDL", "CHOLHDL", "VLDL", "LDLCALC", "LDLDIRECT"  Risk Assessment/Calculations:   CHA2DS2-VASc Score = 3  This indicates a 3.2% annual risk of stroke. The patient's score is based upon: CHF History: 0 HTN History: 1 Diabetes History: 1 Stroke History: 0 Vascular Disease History: 0 Age Score: 1 Gender Score: 0    Home Medications   Current Meds  Medication Sig   amLODipine-benazepril (LOTREL) 5-40 MG capsule TAKE 1 CAPSULE DAILY       (10/04/20 AMLODIPINE  STOPPED)   Cholecalciferol (VITAMIN D) 50 MCG (2000 UT) CAPS Take 1 capsule by mouth daily.   Continuous Blood Gluc Sensor (FREESTYLE LIBRE 2 SENSOR) MISC Inject into the skin as directed. 10-12 times daily   FIASP FLEXTOUCH 100 UNIT/ML FlexTouch Pen Inject 50 Units into the skin. 3-4 times a day   finasteride (PROSCAR) 5 MG tablet Take 1 tablet (5 mg total) by mouth daily.   insulin lispro (HUMALOG) 100 UNIT/ML injection Inject into the skin 3  (three) times daily before meals.   JARDIANCE 25 MG TABS tablet Take 25 mg by mouth daily.   levothyroxine (SYNTHROID) 175 MCG tablet Take 175 mcg by mouth daily.   mirabegron ER (MYRBETRIQ) 25 MG TB24 tablet Take 1 tablet (25 mg total) by mouth daily.   rosuvastatin (CRESTOR) 40 MG tablet Take 40 mg by mouth daily.   spironolactone (ALDACTONE) 25 MG tablet TAKE 1 TABLET DAILY   TRESIBA FLEXTOUCH 200 UNIT/ML FlexTouch Pen Inject 75 Units into the skin 2 (two) times daily.   XARELTO 20 MG TABS tablet TAKE 1 TABLET DAILY     Review of Systems    All other systems reviewed and are otherwise negative except as noted above.  Physical Exam    VS:  BP 114/64 (BP Location: Right Arm, Patient Position: Sitting, Cuff Size: Large)   Pulse 70   Ht 5\' 10"  (1.778 m)   Wt (!) 317 lb 6.4 oz (144 kg)   SpO2 95%   BMI 45.54 kg/m  , BMI Body mass index is 45.54 kg/m.  Wt Readings from Last 3 Encounters:  04/01/23 (!) 317 lb 6.4 oz (144 kg)  10/16/22 (!) 320 lb 3.2 oz (145.2 kg)  02/22/22 (!) 316 lb 6.4 oz (143.5 kg)     GEN: Morbidly obese, 67 y.o. male in no acute distress. HEENT: normal. Neck: Supple, no JVD, carotid bruits, or masses. Cardiac: S1/S2, RRR, no murmurs, rubs, or gallops. No clubbing, cyanosis, edema.  Radials/PT 2+ and equal bilaterally.  Respiratory:  Respirations regular and unlabored, clear to auscultation bilaterally. MS: No deformity or atrophy. Skin: Warm and dry, no rash. Neuro:  Strength and sensation are intact. Psych: Normal affect.  Assessment & Plan    A-fib Denies any tachycardia or palpitations.  Heart rate is well-controlled today.  CrCl > 55. Continue Xarelto, denies any bleeding issues. Heart healthy diet and regular cardiovascular exercise encouraged.   HTN Blood pressure well-controlled.  Continue current medication regimen. Discussed to monitor BP at home at least 2 hours after medications and sitting for 5-10 minutes. Heart healthy diet and regular  cardiovascular exercise encouraged.   CHB, s/p PPM Continue to follow-up with EP.   OSA on CPAP Says he is due for repeat sleep study for a new machine. Will refer to pulmonology.   Morbid obesity BMI 45.54. Weight loss via diet and exercise encouraged. Discussed the impact being overweight would have on cardiovascular risk.  Disposition: Follow up in 1 year(s) with Dina Rich, MD or APP.  Signed, Sharlene Dory, NP 04/01/2023, 5:12 PM [facility]

## 2023-04-01 NOTE — Patient Instructions (Signed)
Medication Instructions:  Your physician recommends that you continue on your current medications as directed. Please refer to the Current Medication list given to you today.   Labwork: none  Testing/Procedures: none  Follow-Up:  Your physician recommends that you schedule a follow-up appointment in: 1 year. You will receive a call in about 10 months reminding you to schedule your appointment. If you do not receive this call, please contact our office.  Any Other Special Instructions Will Be Listed Below (If Applicable).  You have been referred to Pulmonology  If you need a refill on your cardiac medications before your next appointment, please call your pharmacy.

## 2023-04-09 ENCOUNTER — Encounter (INDEPENDENT_AMBULATORY_CARE_PROVIDER_SITE_OTHER): Payer: Self-pay | Admitting: *Deleted

## 2023-04-10 ENCOUNTER — Ambulatory Visit (INDEPENDENT_AMBULATORY_CARE_PROVIDER_SITE_OTHER): Payer: Medicare Other | Admitting: Primary Care

## 2023-04-10 ENCOUNTER — Encounter: Payer: Self-pay | Admitting: Primary Care

## 2023-04-10 VITALS — BP 126/64 | HR 85 | Temp 97.9°F | Ht 70.0 in | Wt 315.0 lb

## 2023-04-10 DIAGNOSIS — G4730 Sleep apnea, unspecified: Secondary | ICD-10-CM

## 2023-04-10 NOTE — Progress Notes (Signed)
@Patient  ID: Jeffery Wright, male    DOB: December 21, 1955, 67 y.o.   MRN: [id number]  Chief Complaint  Patient presents with   Consult    OSA since 1996.  Using CPAP since 1996    Referring provider: Sharlene Dory, NP  HPI: 67 year old male, never smoked.  Past medical history significant for diabetes, overactive bladder, benign prostatic hyperplasia with urinary obstruction, nocturia, OSA.  04/10/2023 Patient presents today for sleep consult. He has severe OSA. Split night sleep study 10/05/2009 >> AHI 82/hour. Currently on CPAP 13cm h20 and compliant with use. He is having no issues with mask fit or pressure settings. He sleeps well at night without residual daytime sleepiness.   Airview download 03/11/23-04/09/23 Usage 30/30 days; 30 days (100%) > 4 hours Average usage 6 hours 16 mins Pressure 13cm h20 Airleaks 1.5L/min (95%) AHI 0.5  Sleep questionnaire Symptoms-  hx sleep apnea  Prior sleep study- Split night sleep study 10/05/2009 >> AHI 82/hour  Bedtime-11pm Time to fall asleep- 10 min Nocturnal awakenings- once Out of bed/start of day- 5am Weight changes- +/- 5 lbs Do you operate heavy machinery- no Do you currently wear CPAP- yes, 13cm h20 Do you current wear oxygen- no [place]- 2  Allergies  Allergen Reactions   Metformin Other (See Comments)    Upset stomach     There is no immunization history on file for this patient.  Past Medical History:  Diagnosis Date   Diabetes mellitus without complication (HCC)    Heart disease    Sleep apnea     Tobacco History: Social History   Tobacco Use  Smoking Status Never   Passive exposure: Never  Smokeless Tobacco Never   Counseling given: Not Answered   Outpatient Medications Prior to Visit  Medication Sig Dispense Refill   amLODipine-benazepril (LOTREL) 5-40 MG capsule TAKE 1 CAPSULE DAILY       (10/04/20 AMLODIPINE        STOPPED) 90 capsule 3   Cholecalciferol (VITAMIN D) 50 MCG (2000 UT) CAPS Take 1  capsule by mouth daily.     Continuous Blood Gluc Sensor (FREESTYLE LIBRE 2 SENSOR) MISC Inject into the skin as directed. 10-12 times daily     FIASP FLEXTOUCH 100 UNIT/ML FlexTouch Pen Inject 50 Units into the skin. 3-4 times a day     finasteride (PROSCAR) 5 MG tablet Take 1 tablet (5 mg total) by mouth daily. 90 tablet 3   GVOKE HYPOPEN 2-PACK 1 MG/0.2ML SOAJ Inject into the skin.     insulin lispro (HUMALOG) 100 UNIT/ML injection Inject into the skin 3 (three) times daily before meals.     JARDIANCE 25 MG TABS tablet Take 25 mg by mouth daily.     levothyroxine (SYNTHROID) 175 MCG tablet Take 175 mcg by mouth daily.     mirabegron ER (MYRBETRIQ) 25 MG TB24 tablet Take 1 tablet (25 mg total) by mouth daily. 90 tablet 3   rosuvastatin (CRESTOR) 40 MG tablet Take 40 mg by mouth daily.     spironolactone (ALDACTONE) 25 MG tablet TAKE 1 TABLET DAILY 90 tablet 3   TRESIBA FLEXTOUCH 200 UNIT/ML FlexTouch Pen Inject 75 Units into the skin 2 (two) times daily.     XARELTO 20 MG TABS tablet TAKE 1 TABLET DAILY 90 tablet 1   No facility-administered medications prior to visit.      Review of Systems  Review of Systems  Constitutional:  Positive for fatigue.  HENT: Negative.    Respiratory: Negative.  Cardiovascular: Negative.      Physical Exam  BP 126/64 (BP Location: Right Arm, Patient Position: Sitting, Cuff Size: Large)   Pulse 85   Temp 97.9 F (36.6 C) (Oral)   Ht 5\' 10"  (1.778 m)   Wt (!) 315 lb (142.9 kg)   SpO2 97%   BMI 45.20 kg/m  Physical Exam Constitutional:      Appearance: Normal appearance.  HENT:     Head: Normocephalic and atraumatic.  Cardiovascular:     Rate and Rhythm: Normal rate and regular rhythm.  Pulmonary:     Effort: Pulmonary effort is normal.     Breath sounds: Normal breath sounds.  Musculoskeletal:        General: Normal range of motion.  Skin:    General: Skin is warm and dry.  Neurological:     General: No focal deficit present.      Mental Status: He is alert and oriented to person, place, and time. Mental status is at baseline.  Psychiatric:        Mood and Affect: Mood normal.        Behavior: Behavior normal.        Thought Content: Thought content normal.        Judgment: Judgment normal.      Lab Results:  CBC No results found for: "WBC", "RBC", "HGB", "HCT", "PLT", "MCV", "MCH", "MCHC", "RDW", "LYMPHSABS", "MONOABS", "EOSABS", "BASOSABS"  BMET No results found for: "NA", "K", "CL", "CO2", "GLUCOSE", "BUN", "CREATININE", "CALCIUM", "GFRNONAA", "GFRAA"  BNP No results found for: "BNP"  ProBNP No results found for: "PROBNP"  Imaging: CUP PACEART REMOTE DEVICE CHECK  Result Date: 03/20/2023 Scheduled remote reviewed. Normal device function.  Next remote 91 days. [place], CVRS    Assessment & Plan:   No problem-specific Assessment & Plan notes found for this encounter.  Severe OSA. Split night sleep study 10/05/2009 >> AHI 82/hour. Currently on CPAP 13cm h20 and compliant with use Sleep apnea is well controlled on current pressure settings No changes Continue to wear CPAP nightly 4-6 hours   Orders: CPAP 13cm h20 Renew supplies, needs new machine   Follow-up: 31-90 days after getting new CPAP unit for compliance check (may be able to do virtual if enrolled in [facility])   Glenford Bayley, NP 04/10/2023

## 2023-04-10 NOTE — Patient Instructions (Addendum)
Great compliance Sleep apnea is well controlled on current pressure settings No changes Continue to wear CPAP nightly 4-6 hours   Orders: CPAP 13cm h20 Renew supplies, needs new machine   Follow-up: 31-90 days after getting new CPAP unit for compliance check (may be able to do virtual if enrolled in airview)   CPAP and BIPAP Information CPAP and BIPAP are methods that use air pressure to keep your airways open and to help you breathe well. CPAP and BIPAP use different amounts of pressure. Your health care provider will tell you whether CPAP or BIPAP would be more helpful for you. CPAP stands for "continuous positive airway pressure." With CPAP, the amount of pressure stays the same while you breathe in (inhale) and out (exhale). BIPAP stands for "bi-level positive airway pressure." With BIPAP, the amount of pressure will be higher when you inhale and lower when you exhale. This allows you to take larger breaths. CPAP or BIPAP may be used in the hospital, or your health care provider may want you to use it at home. You may need to have a sleep study before your health care provider can order a machine for you to use at home. What are the advantages? CPAP or BIPAP can be helpful if you have: Sleep apnea. Chronic obstructive pulmonary disease (COPD). Heart failure. Medical conditions that cause muscle weakness, including muscular dystrophy or amyotrophic lateral sclerosis (ALS). Other problems that cause breathing to be shallow, weak, abnormal, or difficult. CPAP and BIPAP are most commonly used for obstructive sleep apnea (OSA) to keep the airways from collapsing when the muscles relax during sleep. What are the risks? Generally, this is a safe treatment. However, problems may occur, including: Irritated skin or skin sores if the mask does not fit properly. Dry or stuffy nose or nosebleeds. Dry mouth. Feeling gassy or bloated. Sinus or lung infection if the equipment is not cleaned  properly. When should CPAP or BIPAP be used? In most cases, the mask only needs to be worn during sleep. Generally, the mask needs to be worn throughout the night and during any daytime naps. People with certain medical conditions may also need to wear the mask at other times, such as when they are awake. Follow instructions from your health care provider about when to use the machine. What happens during CPAP or BIPAP?  Both CPAP and BIPAP are provided by a small machine with a flexible plastic tube that attaches to a plastic mask that you wear. Air is blown through the mask into your nose or mouth. The amount of pressure that is used to blow the air can be adjusted on the machine. Your health care provider will set the pressure setting and help you find the best mask for you. Tips for using the mask Because the mask needs to be snug, some people feel trapped or closed-in (claustrophobic) when first using the mask. If you feel this way, you may need to get used to the mask. One way to do this is to hold the mask loosely over your nose or mouth and then gradually apply the mask more snugly. You can also gradually increase the amount of time that you use the mask. Masks are available in various types and sizes. If your mask does not fit well, talk with your health care provider about getting a different one. Some common types of masks include: Full face masks, which fit over the mouth and nose. Nasal masks, which fit over the nose. Nasal pillow  or prong masks, which fit into the nostrils. If you are using a mask that fits over your nose and you tend to breathe through your mouth, a chin strap may be applied to help keep your mouth closed. Use a skin barrier to protect your skin as told by your health care provider. Some CPAP and BIPAP machines have alarms that may sound if the mask comes off or develops a leak. If you have trouble with the mask, it is very important that you talk with your health care  provider about finding a way to make the mask easier to tolerate. Do not stop using the mask. There could be a negative impact on your health if you stop using the mask. Tips for using the machine Place your CPAP or BIPAP machine on a secure table or stand near an electrical outlet. Know where the on/off switch is on the machine. Follow instructions from your health care provider about how to set the pressure on your machine and when you should use it. Do not eat or drink while the CPAP or BIPAP machine is on. Food or fluids could get pushed into your lungs by the pressure of the CPAP or BIPAP. For home use, CPAP and BIPAP machines can be rented or purchased through home health care companies. Many different brands of machines are available. Renting a machine before purchasing may help you find out which particular machine works well for you. Your health insurance company may also decide which machine you may get. Keep the CPAP or BIPAP machine and attachments clean. Ask your health care provider for specific instructions. Check the humidifier if you have a dry stuffy nose or nosebleeds. Make sure it is working correctly. Follow these instructions at home: Take over-the-counter and prescription medicines only as told by your health care provider. Ask if you can take sinus medicine if your sinuses are blocked. Do not use any products that contain nicotine or tobacco. These products include cigarettes, chewing tobacco, and vaping devices, such as e-cigarettes. If you need help quitting, ask your health care provider. Keep all follow-up visits. This is important. Contact a health care provider if: You have redness or pressure sores on your head, face, mouth, or nose from the mask or head gear. You have trouble using the CPAP or BIPAP machine. You cannot tolerate wearing the CPAP or BIPAP mask. Someone tells you that you snore even when wearing your CPAP or BIPAP. Get help right away if: You have  trouble breathing. You feel confused. Summary CPAP and BIPAP are methods that use air pressure to keep your airways open and to help you breathe well. If you have trouble with the mask, it is very important that you talk with your health care provider about finding a way to make the mask easier to tolerate. Do not stop using the mask. There could be a negative impact to your health if you stop using the mask. Follow instructions from your health care provider about when to use the machine. This information is not intended to replace advice given to you by your health care provider. Make sure you discuss any questions you have with your health care provider. Document Revised: 06/28/2021 Document Reviewed: 10/28/2020 Elsevier Patient Education  2023 [employer].

## 2023-04-15 ENCOUNTER — Other Ambulatory Visit: Payer: Self-pay

## 2023-04-15 MED ORDER — SPIRONOLACTONE 25 MG PO TABS: 25.0000 mg | ORAL_TABLET | Freq: Every day | ORAL | 2 refills | Status: DC

## 2023-04-15 NOTE — Telephone Encounter (Signed)
The patient called and stated that he needed his spironolactone medication refill sent to [facility].  The medication was faxed to the requested pharmacy.

## 2023-04-22 NOTE — Progress Notes (Signed)
Remote pacemaker transmission.   

## 2023-06-19 ENCOUNTER — Ambulatory Visit (INDEPENDENT_AMBULATORY_CARE_PROVIDER_SITE_OTHER): Payer: Medicare Other

## 2023-06-19 DIAGNOSIS — I442 Atrioventricular block, complete: Secondary | ICD-10-CM | POA: Diagnosis not present

## 2023-06-20 LAB — CUP PACEART REMOTE DEVICE CHECK
Battery Remaining Longevity: 16 mo
Battery Voltage: 2.9 V
Brady Statistic AP VP Percent: 33.43 %
Brady Statistic AP VS Percent: 0 %
Brady Statistic AS VP Percent: 66.56 %
Brady Statistic AS VS Percent: 0.01 %
Brady Statistic RA Percent Paced: 33.42 %
Brady Statistic RV Percent Paced: 99.98 %
Date Time Interrogation Session: 20240717103743
Implantable Lead Connection Status: 753985
Implantable Lead Connection Status: 753985
Implantable Lead Implant Date: 20160525
Implantable Lead Implant Date: 20160525
Implantable Lead Location: 753859
Implantable Lead Location: 753860
Implantable Lead Model: 5076
Implantable Lead Model: 5076
Implantable Pulse Generator Implant Date: 20160525
Lead Channel Impedance Value: 399 Ohm
Lead Channel Impedance Value: 418 Ohm
Lead Channel Impedance Value: 437 Ohm
Lead Channel Impedance Value: 475 Ohm
Lead Channel Pacing Threshold Amplitude: 0.625 V
Lead Channel Pacing Threshold Amplitude: 0.625 V
Lead Channel Pacing Threshold Pulse Width: 0.4 ms
Lead Channel Pacing Threshold Pulse Width: 0.4 ms
Lead Channel Sensing Intrinsic Amplitude: 12.125 mV
Lead Channel Sensing Intrinsic Amplitude: 12.125 mV
Lead Channel Sensing Intrinsic Amplitude: 2.625 mV
Lead Channel Sensing Intrinsic Amplitude: 2.625 mV
Lead Channel Setting Pacing Amplitude: 2 V
Lead Channel Setting Pacing Amplitude: 2 V
Lead Channel Setting Pacing Pulse Width: 0.4 ms
Lead Channel Setting Sensing Sensitivity: 1.2 mV
Zone Setting Status: 755011
Zone Setting Status: 755011

## 2023-07-04 NOTE — Progress Notes (Signed)
Remote pacemaker transmission.   

## 2023-08-06 ENCOUNTER — Other Ambulatory Visit: Payer: Self-pay | Admitting: Cardiology

## 2023-08-06 NOTE — Telephone Encounter (Signed)
Prescription refill request for Xarelto received.  Indication: AF Last office visit: 04/01/23  Jeffery Dapper NP Weight: 144kg Age: 67 Scr: 0.98 on 04/23/23  [facility] CrCl: 144.90  Based on above findings Xarelto 20mg  daily is the appropriate dose.  Refill approved.

## 2023-09-18 ENCOUNTER — Ambulatory Visit (INDEPENDENT_AMBULATORY_CARE_PROVIDER_SITE_OTHER): Payer: Medicare Other

## 2023-09-18 DIAGNOSIS — I442 Atrioventricular block, complete: Secondary | ICD-10-CM | POA: Diagnosis not present

## 2023-09-19 LAB — CUP PACEART REMOTE DEVICE CHECK
Battery Remaining Longevity: 13 mo
Battery Voltage: 2.89 V
Brady Statistic AP VP Percent: 27.62 %
Brady Statistic AP VS Percent: 0 %
Brady Statistic AS VP Percent: 72.37 %
Brady Statistic AS VS Percent: 0.01 %
Brady Statistic RA Percent Paced: 27.61 %
Brady Statistic RV Percent Paced: 99.98 %
Date Time Interrogation Session: 20241016140110
Implantable Lead Connection Status: 753985
Implantable Lead Connection Status: 753985
Implantable Lead Implant Date: 20160525
Implantable Lead Implant Date: 20160525
Implantable Lead Location: 753859
Implantable Lead Location: 753860
Implantable Lead Model: 5076
Implantable Lead Model: 5076
Implantable Pulse Generator Implant Date: 20160525
Lead Channel Impedance Value: 399 Ohm
Lead Channel Impedance Value: 418 Ohm
Lead Channel Impedance Value: 456 Ohm
Lead Channel Impedance Value: 475 Ohm
Lead Channel Pacing Threshold Amplitude: 0.5 V
Lead Channel Pacing Threshold Amplitude: 0.625 V
Lead Channel Pacing Threshold Pulse Width: 0.4 ms
Lead Channel Pacing Threshold Pulse Width: 0.4 ms
Lead Channel Sensing Intrinsic Amplitude: 2.875 mV
Lead Channel Sensing Intrinsic Amplitude: 2.875 mV
Lead Channel Sensing Intrinsic Amplitude: 31.625 mV
Lead Channel Sensing Intrinsic Amplitude: 31.625 mV
Lead Channel Setting Pacing Amplitude: 2 V
Lead Channel Setting Pacing Amplitude: 2 V
Lead Channel Setting Pacing Pulse Width: 0.4 ms
Lead Channel Setting Sensing Sensitivity: 1.2 mV
Zone Setting Status: 755011
Zone Setting Status: 755011

## 2023-10-07 NOTE — Progress Notes (Signed)
Remote pacemaker transmission.   

## 2023-11-01 ENCOUNTER — Other Ambulatory Visit: Payer: Medicare Other

## 2023-11-04 ENCOUNTER — Other Ambulatory Visit: Payer: Medicare Other

## 2023-11-04 DIAGNOSIS — N138 Other obstructive and reflux uropathy: Secondary | ICD-10-CM

## 2023-11-05 LAB — PSA: Prostate Specific Ag, Serum: <0.1 ng/mL (ref 0.0–4.0)

## 2023-11-08 ENCOUNTER — Ambulatory Visit: Payer: Medicare Other | Admitting: Urology

## 2023-11-08 VITALS — BP 131/71 | HR 85

## 2023-11-08 DIAGNOSIS — R351 Nocturia: Secondary | ICD-10-CM

## 2023-11-08 DIAGNOSIS — N5201 Erectile dysfunction due to arterial insufficiency: Secondary | ICD-10-CM | POA: Diagnosis not present

## 2023-11-08 DIAGNOSIS — N3281 Overactive bladder: Secondary | ICD-10-CM

## 2023-11-08 DIAGNOSIS — N138 Other obstructive and reflux uropathy: Secondary | ICD-10-CM | POA: Diagnosis not present

## 2023-11-08 DIAGNOSIS — N401 Enlarged prostate with lower urinary tract symptoms: Secondary | ICD-10-CM | POA: Diagnosis not present

## 2023-11-08 LAB — URINALYSIS, ROUTINE W REFLEX MICROSCOPIC
Bilirubin, UA: NEGATIVE
Ketones, UA: NEGATIVE
Leukocytes,UA: NEGATIVE
Nitrite, UA: NEGATIVE
Protein,UA: NEGATIVE
RBC, UA: NEGATIVE
Specific Gravity, UA: 1.01 (ref 1.005–1.030)
Urobilinogen, Ur: 0.2 mg/dL (ref 0.2–1.0)
pH, UA: 6 (ref 5.0–7.5)

## 2023-11-08 MED ORDER — MIRABEGRON ER 25 MG PO TB24: 25.0000 mg | ORAL_TABLET | Freq: Every day | ORAL | 3 refills | Status: DC

## 2023-11-08 MED ORDER — AMBULATORY NON FORMULARY MEDICATION: 0.2000 mL | 5 refills | Status: DC | PRN

## 2023-11-08 MED ORDER — FINASTERIDE 5 MG PO TABS: 5.0000 mg | ORAL_TABLET | Freq: Every day | ORAL | 3 refills | Status: DC

## 2023-11-08 NOTE — Progress Notes (Unsigned)
11/08/2023 10:31 AM   Jeffery Wright [birth year] [id number]  Referring provider: Elfredia Nevins, MD [address] [place],  [place] [address]  No chief complaint on file.   HPI: Nocturia 1-2x per week. IPSS 4 QOL 0 on mirabegron 25mg  daily. [place] stream storng. No straining to urinate. PSA undetectable He used trimix previously with mixed results   PMH: Past Medical History:  Diagnosis Date   Diabetes mellitus without complication (HCC)    Heart disease    Sleep apnea     Surgical History: Past Surgical History:  Procedure Laterality Date   APPENDECTOMY     HERNIA REPAIR     PACEMAKER IMPLANT      Home Medications:  Allergies as of 11/08/2023       Reactions   Metformin Other (See Comments)   Upset stomach        Medication List        Accurate as of November 08, 2023 10:31 AM. If you have any questions, ask your nurse or doctor.          amLODipine-benazepril 5-40 MG capsule Commonly known as: LOTREL TAKE 1 CAPSULE DAILY       (10/04/20 AMLODIPINE        STOPPED)   Fiasp FlexTouch 100 UNIT/ML FlexTouch Pen Generic drug: insulin aspart Inject 50 Units into the skin. 3-4 times a day   finasteride 5 MG tablet Commonly known as: PROSCAR Take 1 tablet (5 mg total) by mouth daily.   FreeStyle Libre 2 Sensor Misc Inject into the skin as directed. 10-12 times daily   Gvoke HypoPen 2-Pack 1 MG/0.2ML Soaj Generic drug: Glucagon Inject into the skin.   insulin lispro 100 UNIT/ML injection Commonly known as: HUMALOG Inject into the skin 3 (three) times daily before meals.   Jardiance 25 MG Tabs tablet Generic drug: empagliflozin Take 25 mg by mouth daily.   levothyroxine 175 MCG tablet Commonly known as: SYNTHROID Take 175 mcg by mouth daily.   mirabegron ER 25 MG Tb24 tablet Commonly known as: Myrbetriq Take 1 tablet (25 mg total) by mouth daily.   rosuvastatin 40 MG tablet Commonly known as: CRESTOR Take 40 mg by mouth daily.    spironolactone 25 MG tablet Commonly known as: ALDACTONE Take 1 tablet (25 mg total) by mouth daily.   Evaristo Bury FlexTouch 200 UNIT/ML FlexTouch Pen Generic drug: insulin degludec Inject 75 Units into the skin 2 (two) times daily.   Vitamin D 50 MCG (2000 UT) Caps Take 1 capsule by mouth daily.   Xarelto 20 MG Tabs tablet Generic drug: rivaroxaban TAKE 1 TABLET DAILY        Allergies:  Allergies  Allergen Reactions   Metformin Other (See Comments)    Upset stomach    Family History: Family History  Problem Relation Age of Onset   Leukemia Father    Stroke Mother     Social History:  reports that he has never smoked. He has never been exposed to tobacco smoke. He has never used smokeless tobacco. He reports that he does not currently use alcohol. He reports that he does not use drugs.  ROS: All other review of systems were reviewed and are negative except what is noted above in HPI  Physical Exam: BP 131/71   Pulse 85   Constitutional:  Alert and oriented, No acute distress. HEENT: [place] AT, moist mucus membranes.  Trachea midline, no masses. Cardiovascular: No clubbing, cyanosis, or edema. Respiratory: Normal respiratory effort, no increased work  of breathing. GI: Abdomen is soft, nontender, nondistended, no abdominal masses GU: No CVA tenderness.  Lymph: No cervical or inguinal lymphadenopathy. Skin: No rashes, bruises or suspicious lesions. Neurologic: Grossly intact, no focal deficits, moving all 4 extremities. Psychiatric: Normal mood and affect.  Laboratory Data: No results found for: "WBC", "HGB", "HCT", "MCV", "PLT"  No results found for: "CREATININE"  No results found for: "PSA"  No results found for: "TESTOSTERONE"  No results found for: "HGBA1C"  Urinalysis    Component Value Date/Time   APPEARANCEUR Clear 11/10/2021 1122   GLUCOSEU 3+ (A) 11/10/2021 1122   BILIRUBINUR Negative 11/10/2021 1122   PROTEINUR Negative 11/10/2021 1122    UROBILINOGEN negative (A) 03/30/2020 1151   NITRITE Negative 11/10/2021 1122   LEUKOCYTESUR Negative 11/10/2021 1122    Lab Results  Component Value Date   LABMICR Comment 11/10/2021    Pertinent Imaging: *** No results found for this or any previous visit.  No results found for this or any previous visit.  No results found for this or any previous visit.  No results found for this or any previous visit.  No results found for this or any previous visit.  No valid procedures specified. No results found for this or any previous visit.  No results found for this or any previous visit.   Assessment & Plan:    1. Benign prostatic hyperplasia with urinary obstruction *** - Urinalysis, Routine w reflex microscopic  2. Erectile dysfunction due to arterial insufficiency ***  3. OAB (overactive bladder) ***  4. Nocturia ***   No follow-ups on file.  Wilkie Aye, MD  [facility] [place]

## 2023-11-12 ENCOUNTER — Encounter: Payer: Self-pay | Admitting: Urology

## 2023-11-12 NOTE — Patient Instructions (Signed)

## 2023-12-18 ENCOUNTER — Ambulatory Visit (INDEPENDENT_AMBULATORY_CARE_PROVIDER_SITE_OTHER): Payer: Medicare Other

## 2023-12-18 DIAGNOSIS — I442 Atrioventricular block, complete: Secondary | ICD-10-CM

## 2023-12-19 LAB — CUP PACEART REMOTE DEVICE CHECK
Battery Remaining Longevity: 10 mo
Battery Voltage: 2.88 V
Brady Statistic AP VP Percent: 31.75 %
Brady Statistic AP VS Percent: 0 %
Brady Statistic AS VP Percent: 68.24 %
Brady Statistic AS VS Percent: 0.01 %
Brady Statistic RA Percent Paced: 31.73 %
Brady Statistic RV Percent Paced: 99.93 %
Date Time Interrogation Session: 20250115213513
Implantable Lead Connection Status: 753985
Implantable Lead Connection Status: 753985
Implantable Lead Implant Date: 20160525
Implantable Lead Implant Date: 20160525
Implantable Lead Location: 753859
Implantable Lead Location: 753860
Implantable Lead Model: 5076
Implantable Lead Model: 5076
Implantable Pulse Generator Implant Date: 20160525
Lead Channel Impedance Value: 380 Ohm
Lead Channel Impedance Value: 399 Ohm
Lead Channel Impedance Value: 437 Ohm
Lead Channel Impedance Value: 456 Ohm
Lead Channel Pacing Threshold Amplitude: 0.5 V
Lead Channel Pacing Threshold Amplitude: 0.75 V
Lead Channel Pacing Threshold Pulse Width: 0.4 ms
Lead Channel Pacing Threshold Pulse Width: 0.4 ms
Lead Channel Sensing Intrinsic Amplitude: 2.25 mV
Lead Channel Sensing Intrinsic Amplitude: 2.25 mV
Lead Channel Sensing Intrinsic Amplitude: 9.875 mV
Lead Channel Sensing Intrinsic Amplitude: 9.875 mV
Lead Channel Setting Pacing Amplitude: 2 V
Lead Channel Setting Pacing Amplitude: 2 V
Lead Channel Setting Pacing Pulse Width: 0.4 ms
Lead Channel Setting Sensing Sensitivity: 1.2 mV
Zone Setting Status: 755011
Zone Setting Status: 755011

## 2023-12-20 ENCOUNTER — Other Ambulatory Visit: Payer: Self-pay | Admitting: Cardiology

## 2024-01-06 ENCOUNTER — Encounter: Payer: Self-pay | Admitting: Internal Medicine

## 2024-01-07 ENCOUNTER — Other Ambulatory Visit: Payer: Self-pay

## 2024-01-07 MED ORDER — XARELTO 20 MG PO TABS: 20.0000 mg | ORAL_TABLET | Freq: Every day | ORAL | 5 refills | Status: DC

## 2024-01-07 NOTE — Telephone Encounter (Signed)
Prescription refill request for Xarelto received.  Indication:afib Last office visit:4/24 Weight:142.9  kg Age:68 Scr:0.95  11/24 CrCl:148.33  ml/min  Prescription refilled

## 2024-01-23 ENCOUNTER — Ambulatory Visit: Payer: Medicare Other | Attending: Nurse Practitioner | Admitting: Nurse Practitioner

## 2024-01-23 ENCOUNTER — Encounter: Payer: Self-pay | Admitting: Nurse Practitioner

## 2024-01-23 VITALS — BP 124/64 | HR 72 | Ht 70.0 in | Wt 320.0 lb

## 2024-01-23 DIAGNOSIS — Z950 Presence of cardiac pacemaker: Secondary | ICD-10-CM | POA: Diagnosis present

## 2024-01-23 DIAGNOSIS — I4891 Unspecified atrial fibrillation: Secondary | ICD-10-CM

## 2024-01-23 DIAGNOSIS — E785 Hyperlipidemia, unspecified: Secondary | ICD-10-CM

## 2024-01-23 DIAGNOSIS — I10 Essential (primary) hypertension: Secondary | ICD-10-CM | POA: Diagnosis present

## 2024-01-23 NOTE — Progress Notes (Unsigned)
 Office Visit    Patient Name: Jeffery Wright Date of Encounter: 01/23/2024 PCP:  Elfredia Nevins, MD [facility]  Cardiologist:  Dina Rich, MD  Advanced Practice Provider:  No care team member to display Electrophysiologist:  None [phone]}  Chief Complaint and HPI    Jeffery Wright is a 68 y.o. male with a hx of hypertension, A-fib, OSA, type 2 diabetes, complete heart block, s/p PPM, mitral valve insufficiency, who presents today for 1 year follow-up.  Closely followed by EP for past history of pacemaker due to complete heart block.  Last seen by Dr. Dina Rich on 02/22/2022.  Was overall doing well from a cardiac perspective.  Tolerating Xarelto well.  I last saw him for overdue 1 year follow-up on April 01, 2023. Was doing well at the time.   Today he presents for follow-up.  Doing well and denies any acute cardiac concerns or issues. Denies any chest pain, shortness of breath, palpitations, syncope, presyncope, dizziness, orthopnea, PND, swelling or significant weight changes, acute bleeding, or claudication.  Tells me he has had some issues with his pharmacy regarding Xarelto but says he wants to continue this medication he does not want to do regular INR monitoring for Coumadin.  Wants to know if he should be continuing Xarelto.  Does projects and very active and does admit to some bruising at times and wants to see if he can come off this if at all possible.  SH: Originally from [place].  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 Lives on [place] acres of land and enjoys doing projects around the house, currently making paths on his property.    EKGs/Labs/Other Studies Reviewed:   The following studies were reviewed today:   EKG:  EKG Interpretation Date/Time:  Thursday January 23 2024 15:01:28 EST Ventricular Rate:  67 PR Interval:  190 QRS Duration:  164 QT Interval:  470 QTC Calculation: 496 R Axis:   146  Text Interpretation: Normal  sinus rhythm Right axis deviation Left bundle branch block No previous ECGs available Confirmed by Sharlene Dory [phone]) on 01/23/2024 3:09:42 PM   Echo 08/2021:  1. Left ventricular ejection fraction, by estimation, is 55 to 60%. The  left ventricle has normal function. Left ventricular endocardial border  not optimally defined to evaluate regional wall motion. There is moderate  left ventricular hypertrophy. Left  ventricular diastolic parameters are indeterminate.   2. Right ventricular systolic function is normal. The right ventricular  size is normal. Tricuspid regurgitation signal is inadequate for assessing  PA pressure.   3. The mitral valve was not well visualized. No evidence of mitral valve  regurgitation. No evidence of mitral stenosis.   4. The aortic valve was not well visualized. There is mild calcification  of the aortic valve. There is mild thickening of the aortic valve. Aortic  valve regurgitation is not visualized. No aortic stenosis is present.   5. The inferior vena cava is normal in size with greater than 50%  respiratory variability, suggesting right atrial pressure of 3 mmHg.   Comparison(s): Outside Echo from 2016 showed LV EF 55-65%, no RWMA.   Risk Assessment/Calculations:   CHA2DS2-VASc Score = 3  This indicates a 3.2% annual risk of stroke. The patient's score is based upon: CHF History: 0 HTN History: 1 Diabetes History: 1 Stroke History: 0 Vascular Disease History: 0 Age Score: 1 Gender Score: 0   Review of Systems    All other systems reviewed and are  otherwise negative except as noted above.  Physical Exam    VS:  BP 124/64   Pulse 72   Ht 5\' 10"  (1.778 m)   Wt (!) 320 lb (145.2 kg)   SpO2 98%   BMI 45.92 kg/m  , BMI Body mass index is 45.92 kg/m.  Wt Readings from Last 3 Encounters:  01/23/24 (!) 320 lb (145.2 kg)  04/10/23 (!) 315 lb (142.9 kg)  04/01/23 (!) 317 lb 6.4 oz (144 kg)     GEN: Morbidly obese, 68 y.o. male in no  acute distress. HEENT: normal. Neck: Supple, no JVD, carotid bruits, or masses. Cardiac: S1/S2, RRR, no murmurs, rubs, or gallops. No clubbing, cyanosis, edema.  Radials/PT 2+ and equal bilaterally.  Respiratory:  Respirations regular and unlabored, clear to auscultation bilaterally. MS: No deformity or atrophy. Skin: Warm and dry, no rash. Neuro:  Strength and sensation are intact. Psych: Normal affect.  Assessment & Plan    A-fib Denies any tachycardia or palpitations.  Heart rate is well-controlled today.  EKG confirms sinus rhythm.  Continue Xarelto, denies any bleeding issues. Heart healthy diet and regular cardiovascular exercise encouraged.  Past remote device checks reviewed with patient in office today that reveal almost no evidence of A-fib or very low burden rates.  Will confirm with Dr. Wyline Mood and EP regarding continuing Xarelto.   HTN Blood pressure well-controlled.  Continue current medication regimen. Discussed to monitor BP at home at least 2 hours after medications and sitting for 5-10 minutes. Heart healthy diet and regular cardiovascular exercise encouraged.   3.  Hyperlipidemia Labs from November 2024 revealed LDL at 106.  He does admit to some noncompliance with medication mistakenly as he says he he has forgotten to take this medicine some days.  He has returned to taking Crestor and tolerating this well.  No medication changes at this time. Heart healthy diet and regular cardiovascular exercise encouraged.   4. CHB, s/p PPM Denies any issues. Continue to follow-up with EP.   5. Morbid obesity Weight loss via diet and exercise encouraged. Discussed the impact being overweight would have on cardiovascular risk.   Disposition: Follow up in 1 year(s) with Dina Rich, MD or APP.  Signed, Sharlene Dory, NP

## 2024-01-23 NOTE — Patient Instructions (Addendum)

## 2024-01-28 NOTE — Addendum Note (Signed)
 Addended by: Elease Etienne A on: 01/28/2024 10:44 AM   Modules accepted: Orders

## 2024-01-28 NOTE — Progress Notes (Signed)
 Remote pacemaker transmission.

## 2024-02-25 ENCOUNTER — Encounter: Payer: Medicare Other | Admitting: Internal Medicine

## 2024-03-04 ENCOUNTER — Ambulatory Visit: Payer: Medicare Other | Admitting: Internal Medicine

## 2024-03-18 ENCOUNTER — Ambulatory Visit (INDEPENDENT_AMBULATORY_CARE_PROVIDER_SITE_OTHER): Payer: Medicare Other

## 2024-03-18 DIAGNOSIS — I442 Atrioventricular block, complete: Secondary | ICD-10-CM | POA: Diagnosis not present

## 2024-03-19 LAB — CUP PACEART REMOTE DEVICE CHECK
Battery Remaining Longevity: 6 mo
Battery Voltage: 2.86 V
Brady Statistic AP VP Percent: 34.15 %
Brady Statistic AP VS Percent: 0 %
Brady Statistic AS VP Percent: 65.83 %
Brady Statistic AS VS Percent: 0.02 %
Brady Statistic RA Percent Paced: 34 %
Brady Statistic RV Percent Paced: 99.56 %
Date Time Interrogation Session: 20250417122200
Implantable Lead Connection Status: 753985
Implantable Lead Connection Status: 753985
Implantable Lead Implant Date: 20160525
Implantable Lead Implant Date: 20160525
Implantable Lead Location: 753859
Implantable Lead Location: 753860
Implantable Lead Model: 5076
Implantable Lead Model: 5076
Implantable Pulse Generator Implant Date: 20160525
Lead Channel Impedance Value: 399 Ohm
Lead Channel Impedance Value: 437 Ohm
Lead Channel Impedance Value: 437 Ohm
Lead Channel Impedance Value: 494 Ohm
Lead Channel Pacing Threshold Amplitude: 0.625 V
Lead Channel Pacing Threshold Amplitude: 0.75 V
Lead Channel Pacing Threshold Pulse Width: 0.4 ms
Lead Channel Pacing Threshold Pulse Width: 0.4 ms
Lead Channel Sensing Intrinsic Amplitude: 2.75 mV
Lead Channel Sensing Intrinsic Amplitude: 2.75 mV
Lead Channel Sensing Intrinsic Amplitude: 22 mV
Lead Channel Sensing Intrinsic Amplitude: 22 mV
Lead Channel Setting Pacing Amplitude: 2 V
Lead Channel Setting Pacing Amplitude: 2 V
Lead Channel Setting Pacing Pulse Width: 0.4 ms
Lead Channel Setting Sensing Sensitivity: 1.2 mV
Zone Setting Status: 755011
Zone Setting Status: 755011

## 2024-03-20 ENCOUNTER — Telehealth: Payer: Self-pay

## 2024-03-20 ENCOUNTER — Encounter: Payer: Self-pay | Admitting: Internal Medicine

## 2024-03-20 NOTE — Telephone Encounter (Signed)
 Started monthly battery checks. Mychart message sent to patient to advise dates.

## 2024-03-20 NOTE — Telephone Encounter (Signed)
 Mychart message was read according to [facility] 4/18/5 @ 09:18 AM.

## 2024-04-16 LAB — CUP PACEART REMOTE DEVICE CHECK
Battery Remaining Longevity: 6 mo
Battery Voltage: 2.86 V
Brady Statistic AP VP Percent: 35.28 %
Brady Statistic AP VS Percent: 0 %
Brady Statistic AS VP Percent: 64.69 %
Brady Statistic AS VS Percent: 0.03 %
Brady Statistic RA Percent Paced: 35.26 %
Brady Statistic RV Percent Paced: 99.91 %
Date Time Interrogation Session: 20250515085918
Implantable Lead Connection Status: 753985
Implantable Lead Connection Status: 753985
Implantable Lead Implant Date: 20160525
Implantable Lead Implant Date: 20160525
Implantable Lead Location: 753859
Implantable Lead Location: 753860
Implantable Lead Model: 5076
Implantable Lead Model: 5076
Implantable Pulse Generator Implant Date: 20160525
Lead Channel Impedance Value: 380 Ohm
Lead Channel Impedance Value: 418 Ohm
Lead Channel Impedance Value: 437 Ohm
Lead Channel Impedance Value: 456 Ohm
Lead Channel Pacing Threshold Amplitude: 0.5 V
Lead Channel Pacing Threshold Amplitude: 0.625 V
Lead Channel Pacing Threshold Pulse Width: 0.4 ms
Lead Channel Pacing Threshold Pulse Width: 0.4 ms
Lead Channel Sensing Intrinsic Amplitude: 19 mV
Lead Channel Sensing Intrinsic Amplitude: 19 mV
Lead Channel Sensing Intrinsic Amplitude: 2.625 mV
Lead Channel Sensing Intrinsic Amplitude: 2.625 mV
Lead Channel Setting Pacing Amplitude: 2 V
Lead Channel Setting Pacing Amplitude: 2 V
Lead Channel Setting Pacing Pulse Width: 0.4 ms
Lead Channel Setting Sensing Sensitivity: 1.2 mV
Zone Setting Status: 755011
Zone Setting Status: 755011

## 2024-04-20 ENCOUNTER — Ambulatory Visit (INDEPENDENT_AMBULATORY_CARE_PROVIDER_SITE_OTHER)

## 2024-04-20 ENCOUNTER — Ambulatory Visit: Payer: Self-pay | Admitting: Internal Medicine

## 2024-04-20 DIAGNOSIS — I442 Atrioventricular block, complete: Secondary | ICD-10-CM

## 2024-04-29 NOTE — Progress Notes (Signed)
 Remote pacemaker transmission.

## 2024-04-29 NOTE — Addendum Note (Signed)
 Addended by: Lott Rouleau A on: 04/29/2024 02:42 PM   Modules accepted: Orders

## 2024-05-21 ENCOUNTER — Ambulatory Visit (INDEPENDENT_AMBULATORY_CARE_PROVIDER_SITE_OTHER)

## 2024-05-21 DIAGNOSIS — I442 Atrioventricular block, complete: Secondary | ICD-10-CM | POA: Diagnosis not present

## 2024-05-22 LAB — CUP PACEART REMOTE DEVICE CHECK
Battery Remaining Longevity: 5 mo
Battery Voltage: 2.86 V
Brady Statistic AP VP Percent: 30.35 %
Brady Statistic AP VS Percent: 0 %
Brady Statistic AS VP Percent: 69.62 %
Brady Statistic AS VS Percent: 0.03 %
Brady Statistic RA Percent Paced: 30.27 %
Brady Statistic RV Percent Paced: 99.73 %
Date Time Interrogation Session: 20250620064227
Implantable Lead Connection Status: 753985
Implantable Lead Connection Status: 753985
Implantable Lead Implant Date: 20160525
Implantable Lead Implant Date: 20160525
Implantable Lead Location: 753859
Implantable Lead Location: 753860
Implantable Lead Model: 5076
Implantable Lead Model: 5076
Implantable Pulse Generator Implant Date: 20160525
Lead Channel Impedance Value: 399 Ohm
Lead Channel Impedance Value: 437 Ohm
Lead Channel Impedance Value: 437 Ohm
Lead Channel Impedance Value: 475 Ohm
Lead Channel Pacing Threshold Amplitude: 0.625 V
Lead Channel Pacing Threshold Amplitude: 0.75 V
Lead Channel Pacing Threshold Pulse Width: 0.4 ms
Lead Channel Pacing Threshold Pulse Width: 0.4 ms
Lead Channel Sensing Intrinsic Amplitude: 18.5 mV
Lead Channel Sensing Intrinsic Amplitude: 18.5 mV
Lead Channel Sensing Intrinsic Amplitude: 2.25 mV
Lead Channel Sensing Intrinsic Amplitude: 2.25 mV
Lead Channel Setting Pacing Amplitude: 2 V
Lead Channel Setting Pacing Amplitude: 2 V
Lead Channel Setting Pacing Pulse Width: 0.4 ms
Lead Channel Setting Sensing Sensitivity: 1.2 mV
Zone Setting Status: 755011
Zone Setting Status: 755011

## 2024-05-24 ENCOUNTER — Ambulatory Visit: Payer: Self-pay | Admitting: Internal Medicine

## 2024-06-08 NOTE — Progress Notes (Signed)
 Remote pacemaker transmission.

## 2024-06-17 ENCOUNTER — Ambulatory Visit: Payer: Medicare Other

## 2024-06-17 DIAGNOSIS — I442 Atrioventricular block, complete: Secondary | ICD-10-CM

## 2024-06-18 LAB — CUP PACEART REMOTE DEVICE CHECK
Battery Remaining Longevity: 4 mo
Battery Voltage: 2.85 V
Brady Statistic AP VP Percent: 19.83 %
Brady Statistic AP VS Percent: 0.01 %
Brady Statistic AS VP Percent: 80.13 %
Brady Statistic AS VS Percent: 0.04 %
Brady Statistic RA Percent Paced: 19.8 %
Brady Statistic RV Percent Paced: 99.84 %
Date Time Interrogation Session: 20250717133633
Implantable Lead Connection Status: 753985
Implantable Lead Connection Status: 753985
Implantable Lead Implant Date: 20160525
Implantable Lead Implant Date: 20160525
Implantable Lead Location: 753859
Implantable Lead Location: 753860
Implantable Lead Model: 5076
Implantable Lead Model: 5076
Implantable Pulse Generator Implant Date: 20160525
Lead Channel Impedance Value: 380 Ohm
Lead Channel Impedance Value: 437 Ohm
Lead Channel Impedance Value: 437 Ohm
Lead Channel Impedance Value: 494 Ohm
Lead Channel Pacing Threshold Amplitude: 0.625 V
Lead Channel Pacing Threshold Amplitude: 0.75 V
Lead Channel Pacing Threshold Pulse Width: 0.4 ms
Lead Channel Pacing Threshold Pulse Width: 0.4 ms
Lead Channel Sensing Intrinsic Amplitude: 17.625 mV
Lead Channel Sensing Intrinsic Amplitude: 17.625 mV
Lead Channel Sensing Intrinsic Amplitude: 2.375 mV
Lead Channel Sensing Intrinsic Amplitude: 2.375 mV
Lead Channel Setting Pacing Amplitude: 2 V
Lead Channel Setting Pacing Amplitude: 2 V
Lead Channel Setting Pacing Pulse Width: 0.4 ms
Lead Channel Setting Sensing Sensitivity: 1.2 mV
Zone Setting Status: 755011
Zone Setting Status: 755011

## 2024-06-22 ENCOUNTER — Encounter

## 2024-06-22 ENCOUNTER — Ambulatory Visit: Payer: Self-pay | Admitting: Internal Medicine

## 2024-06-29 ENCOUNTER — Other Ambulatory Visit: Payer: Self-pay | Admitting: Cardiology

## 2024-06-29 NOTE — Telephone Encounter (Signed)
 Prescription refill request for Xarelto  received.  Indication: AF Last office visit: 01/23/24  Jeffery Crate NP Weight: 145.2kg Age: 68 Scr: 0.84 on 04/30/24  [facility] CrCl: 170.46  Based on above findings Xarelto  20mg  daily is the appropriate dose.  Refill approved.

## 2024-07-23 ENCOUNTER — Ambulatory Visit

## 2024-07-27 LAB — CUP PACEART REMOTE DEVICE CHECK
Battery Remaining Longevity: 3 mo
Battery Voltage: 2.84 V
Brady Statistic AP VP Percent: 26.49 %
Brady Statistic AP VS Percent: 0 %
Brady Statistic AS VP Percent: 73.49 %
Brady Statistic AS VS Percent: 0.02 %
Brady Statistic RA Percent Paced: 26.48 %
Brady Statistic RV Percent Paced: 99.95 %
Date Time Interrogation Session: 20250823155649
Implantable Lead Connection Status: 753985
Implantable Lead Connection Status: 753985
Implantable Lead Implant Date: 20160525
Implantable Lead Implant Date: 20160525
Implantable Lead Location: 753859
Implantable Lead Location: 753860
Implantable Lead Model: 5076
Implantable Lead Model: 5076
Implantable Pulse Generator Implant Date: 20160525
Lead Channel Impedance Value: 380 Ohm
Lead Channel Impedance Value: 380 Ohm
Lead Channel Impedance Value: 418 Ohm
Lead Channel Impedance Value: 437 Ohm
Lead Channel Pacing Threshold Amplitude: 0.625 V
Lead Channel Pacing Threshold Amplitude: 0.875 V
Lead Channel Pacing Threshold Pulse Width: 0.4 ms
Lead Channel Pacing Threshold Pulse Width: 0.4 ms
Lead Channel Sensing Intrinsic Amplitude: 16.75 mV
Lead Channel Sensing Intrinsic Amplitude: 16.75 mV
Lead Channel Sensing Intrinsic Amplitude: 2.25 mV
Lead Channel Sensing Intrinsic Amplitude: 2.25 mV
Lead Channel Setting Pacing Amplitude: 2 V
Lead Channel Setting Pacing Amplitude: 2 V
Lead Channel Setting Pacing Pulse Width: 0.4 ms
Lead Channel Setting Sensing Sensitivity: 1.2 mV
Zone Setting Status: 755011
Zone Setting Status: 755011

## 2024-07-28 NOTE — Addendum Note (Signed)
 Addended by: VICCI SELLER A on: 07/28/2024 11:44 AM   Modules accepted: Orders

## 2024-07-28 NOTE — Progress Notes (Signed)
 Remote pacemaker transmission.

## 2024-07-29 ENCOUNTER — Ambulatory Visit: Payer: Self-pay | Admitting: Internal Medicine

## 2024-07-31 ENCOUNTER — Ambulatory Visit (HOSPITAL_COMMUNITY): Payer: Self-pay

## 2024-07-31 ENCOUNTER — Telehealth: Payer: Self-pay | Admitting: Nurse Practitioner

## 2024-07-31 ENCOUNTER — Ambulatory Visit (HOSPITAL_COMMUNITY)
Admission: RE | Admit: 2024-07-31 | Discharge: 2024-07-31 | Disposition: A | Discharge status: Home / Self Care | Source: Ambulatory Visit | Attending: Nurse Practitioner | Admitting: Nurse Practitioner

## 2024-07-31 ENCOUNTER — Ambulatory Visit
Admission: RE | Admit: 2024-07-31 | Discharge: 2024-07-31 | Disposition: A | Discharge status: Home / Self Care | Source: Ambulatory Visit | Attending: Nurse Practitioner | Admitting: Nurse Practitioner

## 2024-07-31 VITALS — BP 137/66 | HR 73 | Temp 97.7°F | Resp 18

## 2024-07-31 DIAGNOSIS — M25551 Pain in right hip: Secondary | ICD-10-CM

## 2024-07-31 DIAGNOSIS — M1611 Unilateral primary osteoarthritis, right hip: Secondary | ICD-10-CM | POA: Insufficient documentation

## 2024-07-31 MED ORDER — MELOXICAM 7.5 MG PO TABS: 7.5000 mg | ORAL_TABLET | Freq: Every day | ORAL | 0 refills | Status: AC

## 2024-07-31 MED ORDER — PREDNISONE 20 MG PO TABS
40.0000 mg | ORAL_TABLET | Freq: Every day | ORAL | 0 refills | Status: AC
Start: 2024-07-31 — End: 2024-08-05

## 2024-07-31 MED ORDER — TIZANIDINE HCL 4 MG PO TABS: 4.0000 mg | ORAL_TABLET | Freq: Four times a day (QID) | ORAL | 0 refills | Status: AC | PRN

## 2024-07-31 NOTE — ED Triage Notes (Signed)
 Pt reports he has right side hip pain x 3 weeks. States pain is worsening. Reports trouble/pain  walking  Denies injury

## 2024-07-31 NOTE — Discharge Instructions (Addendum)
 Please go to [facility] for an x-ray of your right hip.  You will need to go to the main entrance of the hospital to get to the radiology department.  You will be contacted when the results of the x-ray are received. Take medication as prescribed.  As discussed, while you are taking the prednisone , you may take Tylenol  for breakthrough pain or discomfort.  Do not begin the meloxicam  until you complete the prednisone .  Also monitor your blood glucose levels closely while you are taking the prednisone .  If your blood glucose exceeds 350, discontinue the prednisone  immediately. Recommend the use of ice or heat.  Apply ice for pain or swelling, heat for spasm or stiffness.  Apply for 20 minutes, remove for 1 hour, repeat as needed. Gentle stretching and range of motion exercises to help with pain. As discussed, if symptoms fail to improve with this treatment, recommend follow-up with orthopedics for further evaluation. Follow-up as needed.

## 2024-07-31 NOTE — ED Provider Notes (Signed)
 [facility]    CSN: [id number] Arrival date & time: 07/31/24  1048      History   Chief Complaint Chief Complaint  Patient presents with   Hip Pain    Entered by patient    HPI Jeffery Wright is a 68 y.o. male.   The history is provided by the patient.   Patient presents with a 3-week history of right hip pain.  Patient denies injury or trauma.  Patient states that the pain started radiating into the right upper leg this morning.  He denies bruising, tingling, numbness, radiation of pain, or the inability to bear weight.  Patient states that walking makes the symptoms worse.  Denies prior history of arthritis or hip pain.  So far, he has been taking over-the-counter medications for his symptoms.  Patient reports prior history of diabetes, reports last A1c was around 6.7..  Past Medical History:  Diagnosis Date   Diabetes mellitus without complication (HCC)    Heart disease    Sleep apnea     Patient Active Problem List   Diagnosis Date Noted   Diabetes mellitus without complication (HCC)    Benign prostatic hyperplasia with urinary obstruction 09/26/2020   Nocturia 09/26/2020   OAB (overactive bladder) 03/30/2020    Past Surgical History:  Procedure Laterality Date   APPENDECTOMY     HERNIA REPAIR     PACEMAKER IMPLANT         Home Medications    Prior to Admission medications   Medication Sig Start Date End Date Taking? Authorizing Provider  AMBULATORY NON FORMULARY MEDICATION 0.2 mLs by Intracavernosal route as needed. Medication Name: Trimix  PGE 40mcg Pap 30mg  Phent 1mg  11/08/23   McKenzie, Belvie CROME, MD  amLODipine -benazepril  (LOTREL) 5-40 MG capsule TAKE 1 CAPSULE DAILY       (10/04/20 AMLODIPINE         STOPPED) 12/11/21   Alvan Dorn FALCON, MD  Cholecalciferol (VITAMIN D) 50 MCG (2000 UT) CAPS Take 1 capsule by mouth daily.    [provider]  Continuous Blood Gluc Sensor (FREESTYLE LIBRE 2 SENSOR) MISC Inject into the skin as  directed. 10-12 times daily 04/25/22   [provider]  finasteride  (PROSCAR ) 5 MG tablet Take 1 tablet (5 mg total) by mouth daily. 11/08/23   McKenzie, Belvie CROME, MD  GVOKE HYPOPEN 2-PACK 1 MG/0.2ML SOAJ Inject into the skin. 10/04/20   [provider]  Insulin Aspart FlexPen (NOVOLOG) 100 UNIT/ML Inject 50 Units into the skin in the morning, at noon, and at bedtime. 01/15/24   [provider]  insulin lispro (HUMALOG) 100 UNIT/ML injection Inject into the skin 3 (three) times daily before meals.    [provider]  JARDIANCE 25 MG TABS tablet Take 25 mg by mouth daily. 12/20/19   [provider]  levothyroxine (SYNTHROID) 175 MCG tablet Take 175 mcg by mouth daily. 01/22/22   [provider]  mirabegron  ER (MYRBETRIQ ) 25 MG TB24 tablet Take 1 tablet (25 mg total) by mouth daily. 11/08/23   McKenzie, Belvie CROME, MD  rosuvastatin (CRESTOR) 40 MG tablet Take 40 mg by mouth daily. 12/24/21   [provider]  spironolactone  (ALDACTONE ) 25 MG tablet TAKE 1 TABLET DAILY 12/20/23   Alvan Dorn FALCON, MD  TRESIBA FLEXTOUCH 200 UNIT/ML FlexTouch Pen Inject 75 Units into the skin 2 (two) times daily. 10/09/21   [provider]  XARELTO  20 MG TABS tablet TAKE 1 TABLET DAILY 06/29/24   Branch,  Dorn FALCON, MD    Family History Family History  Problem Relation Age of Onset   Leukemia Father    Stroke Mother     Social History Social History   Tobacco Use   Smoking status: Never    Passive exposure: Never   Smokeless tobacco: Never  Vaping Use   Vaping status: Never Used  Substance Use Topics   Alcohol use: Not Currently   Drug use: Never     Allergies   Metformin   Review of Systems Review of Systems Per HPI  Physical Exam Triage Vital Signs ED Triage Vitals  Encounter Vitals Group     BP 07/31/24 1100 137/66     Girls Systolic BP Percentile --      Girls Diastolic BP Percentile --      Boys Systolic BP Percentile --       Boys Diastolic BP Percentile --      Pulse Rate 07/31/24 1100 73     Resp 07/31/24 1100 18     Temp 07/31/24 1100 97.7 F (36.5 C)     Temp Source 07/31/24 1100 Oral     SpO2 07/31/24 1100 95 %     Weight --      Height --      Head Circumference --      Peak Flow --      Pain Score 07/31/24 1057 8     Pain Loc --      Pain Education --      Exclude from Growth Chart --    No data found.  Updated Vital Signs BP 137/66 (BP Location: Right Arm)   Pulse 73   Temp 97.7 F (36.5 C) (Oral)   Resp 18   SpO2 95%   Visual Acuity Right Eye Distance:   Left Eye Distance:   Bilateral Distance:    Right Eye Near:   Left Eye Near:    Bilateral Near:     Physical Exam Vitals and nursing note reviewed.  Constitutional:      General: He is not in acute distress.    Appearance: Normal appearance.  HENT:     Head: Normocephalic.  Eyes:     Extraocular Movements: Extraocular movements intact.     Conjunctiva/sclera: Conjunctivae normal.     Pupils: Pupils are equal, round, and reactive to light.  Pulmonary:     Effort: Pulmonary effort is normal.  Musculoskeletal:     Cervical back: Normal range of motion.     Right hip: Tenderness present. No deformity. Decreased range of motion. Normal strength.       Legs:  Skin:    General: Skin is warm and dry.  Neurological:     General: No focal deficit present.     Mental Status: He is alert and oriented to person, place, and time.  Psychiatric:        Mood and Affect: Mood normal.        Behavior: Behavior normal.      UC Treatments / Results  Labs (all labs ordered are listed, but only abnormal results are displayed) Labs Reviewed - No data to display  EKG   Radiology No results found.  Procedures Procedures (including critical care time)  Medications Ordered in UC Medications - No data to display  Initial Impression / Assessment and Plan / UC Course  I have reviewed the triage vital signs and the nursing  notes.  Pertinent labs & imaging results that were available  during my care of the patient were reviewed by me and considered in my medical decision making (see chart for details).  X-ray of the right hip is pending.  Patient does have point tenderness in the right hip.  Patient with underlying history of diabetes.  Differential diagnoses include arthritis of the hip, bursitis, or sciatica.  Will treat with prednisone  40 mg for the next 5 days and tizanidine  4 mg as a muscle relaxer.  Will also have patient begin meloxicam  once the prednisone  is completed.  Supportive care recommendations were provided and discussed with the patient to include the use of ice or heat, and stretching exercises.  Patient was also given parameters regarding his blood glucose while taking the prednisone .  Discussed indications with patient regarding follow-up.  Patient was in agreement with this plan of care and verbalizes understanding.  All questions were answered.  Patient stable for discharge. Is a Final Clinical Impressions(s) / UC Diagnoses   Final diagnoses:  None   Discharge Instructions   None    ED Prescriptions   None    PDMP not reviewed this encounter.   Gilmer Etta PARAS, NP 07/31/24 1141

## 2024-07-31 NOTE — Telephone Encounter (Signed)
 Called patient to discuss x-ray results of the right hip.  Spoke with patient, verified patient with 2 patient identifiers.  Patient was advised that the x-ray was negative for fracture or dislocation but did show right hip joint degeneration.  Advised patient that we will proceed with current treatment recommendations, advised patient if symptoms fail to improve, recommend follow-up with orthopedics for further evaluation.  Patient was in agreement with this plan of care

## 2024-08-12 ENCOUNTER — Ambulatory Visit: Attending: Internal Medicine | Admitting: Internal Medicine

## 2024-08-12 VITALS — BP 110/56 | HR 82 | Ht 70.0 in | Wt 330.0 lb

## 2024-08-12 DIAGNOSIS — I442 Atrioventricular block, complete: Secondary | ICD-10-CM | POA: Diagnosis present

## 2024-08-12 LAB — CUP PACEART INCLINIC DEVICE CHECK
Date Time Interrogation Session: 20250910115420
Implantable Lead Connection Status: 753985
Implantable Lead Connection Status: 753985
Implantable Lead Implant Date: 20160525
Implantable Lead Implant Date: 20160525
Implantable Lead Location: 753859
Implantable Lead Location: 753860
Implantable Lead Model: 5076
Implantable Lead Model: 5076
Implantable Pulse Generator Implant Date: 20160525
Lead Channel Impedance Value: 456 Ohm
Lead Channel Impedance Value: 475 Ohm
Lead Channel Pacing Threshold Amplitude: 0.75 V
Lead Channel Pacing Threshold Amplitude: 0.75 V
Lead Channel Pacing Threshold Pulse Width: 0.4 ms
Lead Channel Pacing Threshold Pulse Width: 0.4 ms
Lead Channel Sensing Intrinsic Amplitude: 2.4 mV

## 2024-08-12 NOTE — Patient Instructions (Signed)
 Medication Instructions:  Your physician recommends that you continue on your current medications as directed. Please refer to the Current Medication list given to you today.  *If you need a refill on your cardiac medications before your next appointment, please call your pharmacy*  Lab Work: NONE   If you have labs (blood work) drawn today and your tests are completely normal, you will receive your results only by: MyChart Message (if you have MyChart) OR A paper copy in the mail If you have any lab test that is abnormal or we need to change your treatment, we will call you to review the results.  Testing/Procedures: NONE   Follow-Up: At [facility], you and your health needs are our priority.  As part of our continuing mission to provide you with exceptional heart care, our providers are all part of one team.  This team includes your primary Cardiologist (physician) and Advanced Practice Providers or APPs (Physician Assistants and Nurse Practitioners) who all work together to provide you with the care you need, when you need it.  Your next appointment:   6 month(s)  Provider:   Dr. Almetta     We recommend signing up for the patient portal called MyChart.  Sign up information is provided on this After Visit Summary.  MyChart is used to connect with patients for Virtual Visits (Telemedicine).  Patients are able to view lab/test results, encounter notes, upcoming appointments, etc.  Non-urgent messages can be sent to your provider as well.   To learn more about what you can do with MyChart, go to [email or web address].   Other Instructions Thank you for choosing [facility]!

## 2024-08-12 NOTE — Progress Notes (Signed)
 HPI [name] presents today for ongoing evaluation and management of his medtronic PPM. He is a pleasant 68 yo man with CHB, s/p PPM insertion, DM, morbid obesity, and HTN. He denies chest pressure or sob. No edema. He has been compliant with his meds.  He is approaching ERI. He feels well.  Allergies  Allergen Reactions   Metformin Other (See Comments)    Upset stomach     Current Outpatient Medications  Medication Sig Dispense Refill   AMBULATORY NON FORMULARY MEDICATION 0.2 mLs by Intracavernosal route as needed. Medication Name: Trimix  PGE 40mcg Pap 30mg  Phent 1mg  5 mL 5   amLODipine -benazepril  (LOTREL) 5-40 MG capsule TAKE 1 CAPSULE DAILY       (10/04/20 AMLODIPINE         STOPPED) 90 capsule 3   Cholecalciferol (VITAMIN D) 50 MCG (2000 UT) CAPS Take 1 capsule by mouth daily.     Continuous Blood Gluc Sensor (FREESTYLE LIBRE 2 SENSOR) MISC Inject into the skin as directed. 10-12 times daily     finasteride  (PROSCAR ) 5 MG tablet Take 1 tablet (5 mg total) by mouth daily. 90 tablet 3   GVOKE HYPOPEN 2-PACK 1 MG/0.2ML SOAJ Inject into the skin.     Insulin Aspart FlexPen (NOVOLOG) 100 UNIT/ML Inject 50 Units into the skin in the morning, at noon, and at bedtime.     insulin lispro (HUMALOG) 100 UNIT/ML injection Inject into the skin 3 (three) times daily before meals.     JARDIANCE 25 MG TABS tablet Take 25 mg by mouth daily.     levothyroxine (SYNTHROID) 175 MCG tablet Take 175 mcg by mouth daily.     meloxicam  (MOBIC ) 7.5 MG tablet Take 1 tablet (7.5 mg total) by mouth daily. 30 tablet 0   mirabegron  ER (MYRBETRIQ ) 25 MG TB24 tablet Take 1 tablet (25 mg total) by mouth daily. 90 tablet 3   rosuvastatin (CRESTOR) 40 MG tablet Take 40 mg by mouth daily.     spironolactone  (ALDACTONE ) 25 MG tablet TAKE 1 TABLET DAILY 90 tablet 3   tiZANidine  (ZANAFLEX ) 4 MG tablet Take 1 tablet (4 mg total) by mouth every 6 (six) hours as needed for muscle spasms. 30 tablet 0   TRESIBA  FLEXTOUCH 200 UNIT/ML FlexTouch Pen Inject 75 Units into the skin 2 (two) times daily.     XARELTO  20 MG TABS tablet TAKE 1 TABLET DAILY 90 tablet 1   No current facility-administered medications for this visit.     Past Medical History:  Diagnosis Date   Diabetes mellitus without complication (HCC)    Heart disease    Sleep apnea     ROS:   All systems reviewed and negative except as noted in the HPI.   Past Surgical History:  Procedure Laterality Date   APPENDECTOMY     HERNIA REPAIR     PACEMAKER IMPLANT       Family History  Problem Relation Age of Onset   Leukemia Father    Stroke Mother      Social History   Socioeconomic History   Marital status: Married    Spouse name: Not on file   Number of children: 2   Years of education: Not on file   Highest education level: Not on file  Occupational History   Occupation: Research scientist (medical)  Tobacco Use   Smoking status: Never    Passive exposure: Never   Smokeless tobacco: Never  Vaping Use   Vaping status:  Never Used  Substance and Sexual Activity   Alcohol use: Not Currently   Drug use: Never   Sexual activity: Not on file  Other Topics Concern   Not on file  Social History Narrative   Not on file   Social Drivers of Health   Financial Resource Strain: Not on file  Food Insecurity: Not on file  Transportation Needs: Not on file  Physical Activity: Not on file  Stress: Not on file  Social Connections: Not on file  Intimate Partner Violence: Not on file     BP (!) 110/56 (BP Location: Left Arm, Patient Position: Sitting, Cuff Size: Large)   Pulse 82   Ht 5' 10 (1.778 m)   Wt (!) 330 lb (149.7 kg)   SpO2 94%   BMI 47.35 kg/m   Physical Exam:  Well appearing NAD HEENT: Unremarkable Neck:  No JVD, no thyromegally Lymphatics:  No adenopathy Back:  No CVA tenderness Lungs:  Clear HEART:  Regular rate rhythm, no murmurs, no rubs, no clicks Abd:  soft, positive bowel sounds, no organomegally, no  rebound, no guarding Ext:  2 plus pulses, no edema, no cyanosis, no clubbing Skin:  No rashes no nodules Neuro:  CN II through XII intact, motor grossly intact  DEVICE  Normal device function.  See PaceArt for details. Approaching ERI.   Assess/Plan:  CHB - he is asymptomatic s/p PPM insertion. He has no escape today. Obesity - he is encouraged to lose weight. HTN - his bp is well controlled.  PPM  - his medtronic DDD PM is 1-2 months from ERI. We will follow.   Danelle Kevin Space,MD

## 2024-08-18 ENCOUNTER — Encounter: Payer: Self-pay | Admitting: Orthopedic Surgery

## 2024-08-18 ENCOUNTER — Ambulatory Visit (INDEPENDENT_AMBULATORY_CARE_PROVIDER_SITE_OTHER): Admitting: Orthopedic Surgery

## 2024-08-18 VITALS — BP 110/56 | Ht 70.0 in | Wt 329.0 lb

## 2024-08-18 DIAGNOSIS — M25551 Pain in right hip: Secondary | ICD-10-CM

## 2024-08-18 NOTE — Progress Notes (Signed)
 New Patient Visit  Assessment: Jeffery Wright is a 68 y.o. male with the following: 1. Pain in right hip  Plan: Jeffery Wright had acute onset pain in the right buttock, lateral hip and radiating into the right groin.  This was 1-2 weeks ago.  He was seen at an urgent care center.  Prednisone  has improved his symptoms.  Currently, he is doing much better.  No additional injuries.  Provided reassurance.  Reviewed radiographs, which does demonstrate some degenerative changes in the right hip.  This could lead to some groin pain.  He is aware.  Nothing further needed at this time.  If he has any other issues, I am happy to see him in clinic at any time.  Follow-up: Return if symptoms worsen or fail to improve.  Subjective:  Chief Complaint  Patient presents with   Back Pain    Right lower back hiptook meds from urgent care feels better no pain in a week    History of Present Illness: Jeffery Wright is a 68 y.o. male who presents for evaluation of right hip pain.  He states that he was traveling, in [place]  getting his cottage ready for the winter and then drove home.  He started to have pain in the right buttock and the lateral hip.  There was some radiating pains into the groin.  He states for 1-2 days, he was unable to walk or sit on the right hip.  He presented to an urgent care center.  Radiographs were negative for an acute fracture.  He does have some degenerative changes in the hip.  He was provided prednisone , as well as a muscle relaxer.   after a few days of prednisone , his symptoms improved.  He is now doing much better.  No pain currently.  He has returned to his usual activities.  He has been working in his yard.  No radiating pains.  Nothing further at this time.   Review of Systems: No fevers or chills No numbness or tingling No chest pain No shortness of breath No bowel or bladder dysfunction No GI distress No headaches   Medical History:  Past  Medical History:  Diagnosis Date   Diabetes mellitus without complication (HCC)    Heart disease    Sleep apnea     Past Surgical History:  Procedure Laterality Date   APPENDECTOMY     HERNIA REPAIR     PACEMAKER IMPLANT      Family History  Problem Relation Age of Onset   Leukemia Father    Stroke Mother    Social History   Tobacco Use   Smoking status: Never    Passive exposure: Never   Smokeless tobacco: Never  Vaping Use   Vaping status: Never Used  Substance Use Topics   Alcohol use: Not Currently   Drug use: Never    Allergies  Allergen Reactions   Metformin Other (See Comments)    Upset stomach    Current Meds  Medication Sig   AMBULATORY NON FORMULARY MEDICATION 0.2 mLs by Intracavernosal route as needed. Medication Name: Trimix  PGE 40mcg Pap 30mg  Phent 1mg    amLODipine -benazepril  (LOTREL) 5-40 MG capsule TAKE 1 CAPSULE DAILY       (10/04/20 AMLODIPINE         STOPPED)   Cholecalciferol (VITAMIN D) 50 MCG (2000 UT) CAPS Take 1 capsule by mouth daily.   Continuous Blood Gluc Sensor (FREESTYLE LIBRE 2 SENSOR) MISC Inject into the skin as  directed. 10-12 times daily   finasteride  (PROSCAR ) 5 MG tablet Take 1 tablet (5 mg total) by mouth daily.   GVOKE HYPOPEN 2-PACK 1 MG/0.2ML SOAJ Inject into the skin.   Insulin Aspart FlexPen (NOVOLOG) 100 UNIT/ML Inject 50 Units into the skin in the morning, at noon, and at bedtime.   insulin lispro (HUMALOG) 100 UNIT/ML injection Inject into the skin 3 (three) times daily before meals.   JARDIANCE 25 MG TABS tablet Take 25 mg by mouth daily.   levothyroxine (SYNTHROID) 175 MCG tablet Take 175 mcg by mouth daily.   meloxicam  (MOBIC ) 7.5 MG tablet Take 1 tablet (7.5 mg total) by mouth daily.   mirabegron  ER (MYRBETRIQ ) 25 MG TB24 tablet Take 1 tablet (25 mg total) by mouth daily.   rosuvastatin (CRESTOR) 40 MG tablet Take 40 mg by mouth daily.   spironolactone  (ALDACTONE ) 25 MG tablet TAKE 1 TABLET DAILY   tiZANidine   (ZANAFLEX ) 4 MG tablet Take 1 tablet (4 mg total) by mouth every 6 (six) hours as needed for muscle spasms.   TRESIBA FLEXTOUCH 200 UNIT/ML FlexTouch Pen Inject 75 Units into the skin 2 (two) times daily.   XARELTO  20 MG TABS tablet TAKE 1 TABLET DAILY    Objective: BP (!) 110/56 Comment: 08/12/24  Ht 5' 10 (1.778 m)   Wt (!) 329 lb (149.2 kg)   BMI 47.21 kg/m   Physical Exam:  General: Alert and oriented. and No acute distress. Gait: Normal gait.  Evaluation the right hip demonstrates no deformity.  No tenderness to palpation over the lateral hip.  Negative straight leg raise bilaterally.  Sensation intact distally.  No discomfort with internal and external rotation of the right hip.  IMAGING: I personally reviewed images previously obtained from the ED  X-rays were previously obtained.  Moderate degenerative change of the right hip.  There appears to be a large cyst within the femoral head.  Calcification of the labrum laterally.  No acute injuries.   New Medications:  No orders of the defined types were placed in this encounter.     Oneil DELENA Horde, MD  08/18/2024 9:46 AM

## 2024-08-24 ENCOUNTER — Ambulatory Visit

## 2024-08-25 LAB — CUP PACEART REMOTE DEVICE CHECK
Battery Remaining Longevity: 2 mo
Battery Voltage: 2.84 V
Brady Statistic AP VP Percent: 15.65 %
Brady Statistic AP VS Percent: 0 %
Brady Statistic AS VP Percent: 84.33 %
Brady Statistic AS VS Percent: 0.02 %
Brady Statistic RA Percent Paced: 15.64 %
Brady Statistic RV Percent Paced: 99.92 %
Date Time Interrogation Session: 20250923112446
Implantable Lead Connection Status: 753985
Implantable Lead Connection Status: 753985
Implantable Lead Implant Date: 20160525
Implantable Lead Implant Date: 20160525
Implantable Lead Location: 753859
Implantable Lead Location: 753860
Implantable Lead Model: 5076
Implantable Lead Model: 5076
Implantable Pulse Generator Implant Date: 20160525
Lead Channel Impedance Value: 399 Ohm
Lead Channel Impedance Value: 456 Ohm
Lead Channel Impedance Value: 475 Ohm
Lead Channel Impedance Value: 513 Ohm
Lead Channel Pacing Threshold Amplitude: 0.625 V
Lead Channel Pacing Threshold Amplitude: 0.75 V
Lead Channel Pacing Threshold Pulse Width: 0.4 ms
Lead Channel Pacing Threshold Pulse Width: 0.4 ms
Lead Channel Sensing Intrinsic Amplitude: 14.875 mV
Lead Channel Sensing Intrinsic Amplitude: 14.875 mV
Lead Channel Sensing Intrinsic Amplitude: 2.625 mV
Lead Channel Sensing Intrinsic Amplitude: 2.625 mV
Lead Channel Setting Pacing Amplitude: 2 V
Lead Channel Setting Pacing Amplitude: 2 V
Lead Channel Setting Pacing Pulse Width: 0.4 ms
Lead Channel Setting Sensing Sensitivity: 1.2 mV
Zone Setting Status: 755011
Zone Setting Status: 755011

## 2024-08-30 ENCOUNTER — Ambulatory Visit: Payer: Self-pay | Admitting: Internal Medicine

## 2024-09-03 ENCOUNTER — Encounter: Payer: Self-pay | Admitting: Primary Care

## 2024-09-03 ENCOUNTER — Ambulatory Visit: Admitting: Primary Care

## 2024-09-03 VITALS — BP 124/58 | HR 75 | Temp 97.8°F | Ht 70.0 in | Wt 329.8 lb

## 2024-09-03 DIAGNOSIS — Z950 Presence of cardiac pacemaker: Secondary | ICD-10-CM | POA: Insufficient documentation

## 2024-09-03 DIAGNOSIS — E669 Obesity, unspecified: Secondary | ICD-10-CM | POA: Diagnosis not present

## 2024-09-03 DIAGNOSIS — I10 Essential (primary) hypertension: Secondary | ICD-10-CM | POA: Insufficient documentation

## 2024-09-03 DIAGNOSIS — G4733 Obstructive sleep apnea (adult) (pediatric): Secondary | ICD-10-CM | POA: Diagnosis not present

## 2024-09-03 DIAGNOSIS — G4730 Sleep apnea, unspecified: Secondary | ICD-10-CM

## 2024-09-03 DIAGNOSIS — R351 Nocturia: Secondary | ICD-10-CM

## 2024-09-03 NOTE — Progress Notes (Signed)
 @Patient  ID: Jeffery Wright, male    DOB: 1956/04/01, 68 y.o.   MRN: [id number]  No chief complaint on file.   Referring provider: Bertell Satterfield, MD  HPI: 68 year old male, never smoked.  Past medical history significant for diabetes, overactive bladder, benign prostatic hyperplasia with urinary obstruction, nocturia, OSA.   Previous [facility] pulmonary encounter: 04/10/2023 Patient presents today for sleep consult. He has severe OSA. Split night sleep study 10/05/2009 >> AHI 82/hour. Currently on CPAP 13cm h20 and compliant with use. He is having no issues with mask fit or pressure settings. He sleeps well at night without residual daytime sleepiness.    Airview download 03/11/23-04/09/23 Usage 30/30 days; 30 days (100%) > 4 hours Average usage 6 hours 16 mins Pressure 13cm h20 Airleaks 1.5L/min (95%) AHI 0.5   Sleep questionnaire Symptoms-  hx sleep apnea  Prior sleep study- Split night sleep study 10/05/2009 >> AHI 82/hour  Bedtime-11pm Time to fall asleep- 10 min Nocturnal awakenings- once Out of bed/start of day- 5am Weight changes- +/- 5 lbs Do you operate heavy machinery- no Do you currently wear CPAP- yes, 13cm h20 Do you current wear oxygen- no Epworth- 2    Severe OSA. Split night sleep study 10/05/2009 >> AHI 82/hour. Currently on CPAP 13cm h20 and compliant with use Sleep apnea is well controlled on current pressure settings No changes Continue to wear CPAP nightly 4-6 hours    Orders: CPAP 13cm h20 Renew supplies, needs new machine    09/03/2024- Interim hx  Discussed the use of AI scribe software for clinical note transcription with the patient, who gave verbal consent to proceed.  History of Present Illness Jeffery Wright is a 68 year old male with severe sleep apnea who presents for a follow-up to renew CPAP supplies.  He has a long-standing history of severe sleep apnea, diagnosed over 15 years ago, with a sleep study on October 05, 2009, showing 82  apneas per hour. He has been using a CPAP machine since then. His current machine was replaced in 2024, and he has no issues with it. He uses the CPAP 100% of the time, averaging 5 hours and 37 minutes per night, with a pressure setting of 14. His apnea score is 0.3, with less than one event per hour. He notes significant improvement in sleep quality with CPAP use and has a generator for power outages.  He has a history of diabetes, hypertension, and overactive bladder. He takes medication for high blood pressure and diabetes. He also has a cardiac pacemaker for synchronization.  He mentions a recent hip problem that limited his activity for several weeks, impacting his weight management. He is retired but stays active by working outside, particularly with tasks like cutting fallen trees, and anticipates losing about 15 pounds in the next three months.      Allergies  Allergen Reactions   Metformin Other (See Comments)    Upset stomach     There is no immunization history on file for this patient.  Past Medical History:  Diagnosis Date   Diabetes mellitus without complication (HCC)    Heart disease    Sleep apnea     Tobacco History: Social History   Tobacco Use  Smoking Status Never   Passive exposure: Never  Smokeless Tobacco Never   Counseling given: Not Answered   Outpatient Medications Prior to Visit  Medication Sig Dispense Refill   AMBULATORY NON FORMULARY MEDICATION 0.2 mLs by Intracavernosal route as needed. Medication Name:  Trimix  PGE 40mcg Pap 30mg  Phent 1mg  5 mL 5   amLODipine -benazepril  (LOTREL) 5-40 MG capsule TAKE 1 CAPSULE DAILY       (10/04/20 AMLODIPINE         STOPPED) 90 capsule 3   Cholecalciferol (VITAMIN D) 50 MCG (2000 UT) CAPS Take 1 capsule by mouth daily.     Continuous Blood Gluc Sensor (FREESTYLE LIBRE 2 SENSOR) MISC Inject into the skin as directed. 10-12 times daily     finasteride  (PROSCAR ) 5 MG tablet Take 1 tablet (5 mg total) by mouth  daily. 90 tablet 3   GVOKE HYPOPEN 2-PACK 1 MG/0.2ML SOAJ Inject into the skin.     Insulin Aspart FlexPen (NOVOLOG) 100 UNIT/ML Inject 50 Units into the skin in the morning, at noon, and at bedtime.     insulin lispro (HUMALOG) 100 UNIT/ML injection Inject into the skin 3 (three) times daily before meals.     JARDIANCE 25 MG TABS tablet Take 25 mg by mouth daily.     levothyroxine (SYNTHROID) 175 MCG tablet Take 175 mcg by mouth daily.     meloxicam  (MOBIC ) 7.5 MG tablet Take 1 tablet (7.5 mg total) by mouth daily. 30 tablet 0   mirabegron  ER (MYRBETRIQ ) 25 MG TB24 tablet Take 1 tablet (25 mg total) by mouth daily. 90 tablet 3   rosuvastatin (CRESTOR) 40 MG tablet Take 40 mg by mouth daily.     spironolactone  (ALDACTONE ) 25 MG tablet TAKE 1 TABLET DAILY 90 tablet 3   tiZANidine  (ZANAFLEX ) 4 MG tablet Take 1 tablet (4 mg total) by mouth every 6 (six) hours as needed for muscle spasms. 30 tablet 0   TRESIBA FLEXTOUCH 200 UNIT/ML FlexTouch Pen Inject 75 Units into the skin 2 (two) times daily.     XARELTO  20 MG TABS tablet TAKE 1 TABLET DAILY 90 tablet 1   No facility-administered medications prior to visit.    Review of Systems  Review of Systems  Constitutional: Negative.   Respiratory: Negative.       Physical Exam  There were no vitals taken for this visit. Physical Exam Constitutional:      Appearance: Normal appearance. He is well-developed. He is obese.  HENT:     Head: Normocephalic and atraumatic.     Mouth/Throat:     Mouth: Mucous membranes are moist.     Pharynx: Oropharynx is clear.  Cardiovascular:     Rate and Rhythm: Normal rate and regular rhythm.     Heart sounds: Normal heart sounds.     Comments: RRR Pulmonary:     Effort: Pulmonary effort is normal. No respiratory distress.     Breath sounds: Normal breath sounds. No wheezing or rhonchi.     Comments: CTA Musculoskeletal:        General: Normal range of motion.     Cervical back: Normal range of motion  and neck supple.  Skin:    General: Skin is warm and dry.     Findings: No erythema or rash.  Neurological:     General: No focal deficit present.     Mental Status: He is alert and oriented to person, place, and time. Mental status is at baseline.  Psychiatric:        Mood and Affect: Mood normal.        Behavior: Behavior normal.        Thought Content: Thought content normal.        Judgment: Judgment normal.  Lab Results:  CBC No results found for: WBC, RBC, HGB, HCT, PLT, MCV, MCH, MCHC, RDW, LYMPHSABS, MONOABS, EOSABS, BASOSABS  BMET No results found for: NA, K, CL, CO2, GLUCOSE, BUN, CREATININE, CALCIUM, GFRNONAA, GFRAA  BNP No results found for: BNP  ProBNP No results found for: PROBNP  Imaging: CUP PACEART REMOTE DEVICE CHECK Result Date: 08/25/2024 Monthly battery check.  Estimated 22mo Normal device function. No new alerts. Follow up as scheduled monthly LA, CVRS  CUP PACEART INCLINIC DEVICE CHECK Result Date: 08/12/2024 Normal in-clinic dual chamber pacemaker check. Presenting Rhythm: AS/VP- 70 . Routine testing of thresholds, sensing, and impedance demonstrate stable parameters and no programming changes needed at this time. No episodes. Estimated longevity 2 months until ERI . Pt enrolled in monthly remote follow-up for battery checks.Powell Level, BSN, RN    Assessment & Plan:   1. Severe sleep apnea (Primary) - Ambulatory Referral for DME  Assessment and Plan Assessment & Plan Obstructive sleep apnea on CPAP therapy Severe obstructive sleep apnea diagnosed in 2010 with 82 apneas per hour, currently well-controlled with CPAP therapy. He uses the machine 100% of the time, averaging 5 hours and 37 minutes per night. Current pressure setting is 14cm h20, with an apnea score of 0.3, indicating less than one event per hour. No significant residual apneas reported. No issues with the current CPAP machine,  replaced in 2024. Reports improved sleep quality with CPAP use. Has a generator for power outages to ensure continuous CPAP use. - Continue CPAP use nightly at current pressure settings  - Renew CPAP supplies order with [employer] - Ensure annual follow-up to renew CPAP prescription if billing through insurance. - Advise on regular replacement of CPAP components: cushion and filter monthly, tubing and mask frame every three months, headgear and water chamber every six months.  Obesity Obesity with discussion on potential benefits of weight loss for improving sleep apnea. Introduced Zepbound, a GLP medication approved for obesity and sleep apnea, though insurance coverage is variable.  - Discuss weight loss options, including potential use of Zepbound, if interested in the future.   Almarie LELON Ferrari, NP 09/03/2024

## 2024-09-03 NOTE — Patient Instructions (Addendum)
  VISIT SUMMARY: Jeffery Wright is a 68 year old male with severe sleep apnea who came in for a follow-up to renew his CPAP supplies. He has been using a CPAP machine for over 15 years, which has significantly improved his sleep quality. He also has a history of diabetes, hypertension, and overactive bladder, and he has a cardiac pacemaker. Recently, he experienced a hip problem that limited his activity, but he remains active and aims to lose weight in the coming months.  YOUR PLAN: -OBSTRUCTIVE SLEEP APNEA ON CPAP THERAPY: Obstructive sleep apnea is a condition where the airway becomes blocked during sleep, causing breathing pauses. Your sleep apnea is well-controlled with your CPAP machine, which you use consistently. We will renew your CPAP supplies and advise you to replace the cushion and filter monthly, tubing and mask frame every three months, and headgear and water chamber every six months. Please ensure an annual follow-up to renew your CPAP prescription if billing through insurance.  -OBESITY: Obesity is a condition where excess body fat may negatively affect your health. We discussed the potential benefits of weight loss for improving your sleep apnea. We introduced Zepbound, a medication approved for obesity and sleep apnea, though insurance coverage may vary. We can discuss weight loss options, including the potential use of Zepbound, if you are interested in the future.  INSTRUCTIONS: Please follow up annually to renew your CPAP prescription if billing through insurance. Replace CPAP components regularly as advised. Consider discussing weight loss options, including the potential use of Zepbound, if interested.  Follow-up 1 year with Landry NP

## 2024-09-09 NOTE — Progress Notes (Addendum)
 Remote PPM Transmission

## 2024-09-16 ENCOUNTER — Ambulatory Visit: Admitting: Primary Care

## 2024-09-18 ENCOUNTER — Telehealth: Payer: Self-pay

## 2024-09-18 DIAGNOSIS — G4733 Obstructive sleep apnea (adult) (pediatric): Secondary | ICD-10-CM

## 2024-09-18 NOTE — Addendum Note (Signed)
 Addended by: Aryahi Denzler M on: 09/18/2024 11:00 AM   Modules accepted: Orders

## 2024-09-18 NOTE — Telephone Encounter (Signed)
 Copied from CRM [id number]. Topic: Clinical - Order For Equipment >> Sep 15, 2024  2:08 PM Celestine FALCON wrote: Reason for CRM: Darice with [facility] on behalf of [employer] is calling to have a prescription for cpap supplies including 6 filters and 6 cushions faxed to [phone]. They received a fax last week but it was a doctor evaluation and not a prescription.

## 2024-09-18 NOTE — Telephone Encounter (Signed)
 Placed DME order this encounter for CPAP supplies to [employer].

## 2024-09-24 ENCOUNTER — Ambulatory Visit

## 2024-09-24 DIAGNOSIS — I442 Atrioventricular block, complete: Secondary | ICD-10-CM

## 2024-09-25 ENCOUNTER — Telehealth: Payer: Self-pay

## 2024-09-25 ENCOUNTER — Ambulatory Visit: Payer: Self-pay | Admitting: Internal Medicine

## 2024-09-25 DIAGNOSIS — Z01818 Encounter for other preprocedural examination: Secondary | ICD-10-CM

## 2024-09-25 DIAGNOSIS — I442 Atrioventricular block, complete: Secondary | ICD-10-CM

## 2024-09-25 LAB — CUP PACEART REMOTE DEVICE CHECK
Battery Remaining Longevity: 2 mo
Battery Voltage: 2.83 V
Brady Statistic AP VP Percent: 18.58 %
Brady Statistic AP VS Percent: 0 %
Brady Statistic AS VP Percent: 81.4 %
Brady Statistic AS VS Percent: 0.02 %
Brady Statistic RA Percent Paced: 18.56 %
Brady Statistic RV Percent Paced: 99.9 %
Date Time Interrogation Session: 20251023202434
Implantable Lead Connection Status: 753985
Implantable Lead Connection Status: 753985
Implantable Lead Implant Date: 20160525
Implantable Lead Implant Date: 20160525
Implantable Lead Location: 753859
Implantable Lead Location: 753860
Implantable Lead Model: 5076
Implantable Lead Model: 5076
Implantable Pulse Generator Implant Date: 20160525
Lead Channel Impedance Value: 380 Ohm
Lead Channel Impedance Value: 399 Ohm
Lead Channel Impedance Value: 437 Ohm
Lead Channel Impedance Value: 456 Ohm
Lead Channel Pacing Threshold Amplitude: 0.625 V
Lead Channel Pacing Threshold Amplitude: 0.75 V
Lead Channel Pacing Threshold Pulse Width: 0.4 ms
Lead Channel Pacing Threshold Pulse Width: 0.4 ms
Lead Channel Sensing Intrinsic Amplitude: 2.625 mV
Lead Channel Sensing Intrinsic Amplitude: 2.625 mV
Lead Channel Sensing Intrinsic Amplitude: 9.875 mV
Lead Channel Sensing Intrinsic Amplitude: 9.875 mV
Lead Channel Setting Pacing Amplitude: 2 V
Lead Channel Setting Pacing Amplitude: 2 V
Lead Channel Setting Pacing Pulse Width: 0.4 ms
Lead Channel Setting Sensing Sensitivity: 1.2 mV
Zone Setting Status: 755011
Zone Setting Status: 755011

## 2024-09-25 NOTE — Telephone Encounter (Signed)
 Alert received:  PPM Scheduled remote reviewed. Normal device function.  Presenting rhythm:  AS/VP RRT reached 09/24/24.  Outreach made to Pt.  Advised he had reached ERI.  Per Pt Dr. Waddell had discussed gen change with him and advised him he did not need another appointment when he reached ERI.  Advised Pt he would get a call from EP scheduler with date/instructions for gen change.

## 2024-09-28 ENCOUNTER — Telehealth: Payer: Self-pay

## 2024-09-28 NOTE — Progress Notes (Signed)
 Remote PPM Transmission

## 2024-09-28 NOTE — Telephone Encounter (Signed)
 Paper received from [employer] requesting a provider signature for CPAP supplies. Once this has been signed, I will fax.

## 2024-10-01 ENCOUNTER — Telehealth: Payer: Self-pay | Admitting: *Deleted

## 2024-10-01 NOTE — Telephone Encounter (Signed)
 Called pt and scheduled his PPM Gen Change with Dr. Waddell for 11/7 at 7:30 am. He will go to [facility] in [place] to have labs done.   I will send Instruction letter via MyChart per pt's request.

## 2024-10-01 NOTE — Telephone Encounter (Signed)
 Copied from CRM [id number]. Topic: Clinical - Order For Equipment >> Sep 30, 2024 11:02 AM Celestine FALCON wrote: Reason for CRM: Tin with [employer] calling to confirm info regarding the pt.   Confirmed NP Almarie Ferrari sees the pt, last visit 09/03/2024, address is [address] [place] [address], fax number [phone], and phone number is [phone].   I confirmed the paperwork from [employer] was received on 09/28/2024 at 410pm via encounter from [facility] stating Paper received from [employer] requesting a provider signature for CPAP supplies. Once this has been signed, I will fax. >> Oct 01, 2024  2:10 PM Corean SAUNDERS wrote: Patient is mildly frustrated that [employer] still does not have a prescription for his CPAPs / supplies and is requesting Almarie Ferrari or nurse to please send in all required information today.   [place], Has this been signed and faxed back to [employer]?

## 2024-10-02 ENCOUNTER — Telehealth (HOSPITAL_COMMUNITY): Payer: Self-pay

## 2024-10-02 NOTE — Telephone Encounter (Signed)
 Signed and faxed

## 2024-10-02 NOTE — Telephone Encounter (Signed)
 Spoke with patient to discuss upcoming procedure.   Confirmed patient is scheduled for a PPM generator change on Friday, November 7 with Dr. Dr. Waddell. Instructed patient to arrive at the Main Entrance A at [facility]: [address] [place], [place] [address] and check in at Admitting at 5:30 AM.   Labs: reports completed today  Any recent signs of acute illness or been started on antibiotics? No  Any new medications started? No Any medications to hold? Yes HOLD: Xarelto  (Rivaroxaban ) for 2 day(s) prior to your procedure. Your last dose will be Tuesday, November 4, PM dose.  HOLD: Empagliflozin (Jardiance) for 3 days prior to the procedure. Last dose on Monday, November 03.   Tresiba (Insulin): The day before your procedure only take  of your usual dinner/bedtime dose. The morning of your procedure you will only take  of your usual dose.   Humalog (Insulin): No bedtime dose the day before your procedure. The day of your procedure if your CBG is greater than 220 mg/dL, you may take 1/2 of your usual dose.  Novolog Flexpen (Insulin): No bedtime dose the day before your procedure. The day of your procedure if your CBG is greater than 220 mg/dL, you may take 1/2 of your usual dose. NO eating or drinking after midnight (NPO after 12:00am) except for sips of water with your medications. The night before your procedure and the morning of your procedure, wash thoroughly with the CHG surgical soap from the neck down, paying special attention to the area where your procedure will be performed.  Advised of plan to go home the same day and will only stay overnight if medically necessary. You MUST have a responsible adult to drive you home and MUST be with you the first 24 hours after you arrive home.  Informed patient a nurse will call a day before the procedure to confirm arrival time and ensure instructions are followed.  Patient verbalized understanding to all instructions provided and  agreed to proceed with procedure.

## 2024-10-03 LAB — CBC
Hematocrit: 42.3 % (ref 37.5–51.0)
Hemoglobin: 14.5 g/dL (ref 13.0–17.7)
MCH: 33.8 pg — ABNORMAL HIGH (ref 26.6–33.0)
MCHC: 34.3 g/dL (ref 31.5–35.7)
MCV: 99 fL — ABNORMAL HIGH (ref 79–97)
Platelets: 233 x10E3/uL (ref 150–450)
RBC: 4.29 x10E6/uL (ref 4.14–5.80)
RDW: 13 % (ref 11.6–15.4)
WBC: 7.8 x10E3/uL (ref 3.4–10.8)

## 2024-10-03 LAB — BASIC METABOLIC PANEL WITH GFR
BUN/Creatinine Ratio: 24 (ref 10–24)
BUN: 22 mg/dL (ref 8–27)
CO2: 22 mmol/L (ref 20–29)
Calcium: 9.4 mg/dL (ref 8.6–10.2)
Chloride: 104 mmol/L (ref 96–106)
Creatinine, Ser: 0.9 mg/dL (ref 0.76–1.27)
Glucose: 207 mg/dL — ABNORMAL HIGH (ref 70–99)
Potassium: 4.5 mmol/L (ref 3.5–5.2)
Sodium: 141 mmol/L (ref 134–144)
eGFR: 92 mL/min/1.73 (ref >59)

## 2024-10-06 NOTE — Telephone Encounter (Signed)
 Yes. This has been signed and faxed to [employer].

## 2024-10-06 NOTE — Telephone Encounter (Signed)
 Nothing further needed

## 2024-10-08 MED ORDER — POVIDONE-IODINE 10 % EX SWAB: 2.0000 | Freq: Once | CUTANEOUS | Status: DC

## 2024-10-08 MED ORDER — SODIUM CHLORIDE 0.9 % IV SOLN: INTRAVENOUS | Status: DC

## 2024-10-08 MED ORDER — SODIUM CHLORIDE 0.9 % IV SOLN: 80.0000 mg | INTRAVENOUS | Status: AC

## 2024-10-08 MED ORDER — CHLORHEXIDINE GLUCONATE 4 % EX SOLN
4.0000 | Freq: Once | CUTANEOUS | Status: DC
  Filled 2024-10-08: qty 60

## 2024-10-08 MED ORDER — CEFAZOLIN SODIUM-DEXTROSE 3-4 GM/150ML-% IV SOLN
3.0000 g | INTRAVENOUS | Status: AC
  Administered 2024-10-09: 3 g via INTRAVENOUS
  Filled 2024-10-08 (×2): qty 150

## 2024-10-08 NOTE — Pre-Procedure Instructions (Signed)
 Attempted to call patient regarding procedure instructions.  Left voicemail on the following items: Arrival time 0515 Nothing to eat or drink after midnight No meds AM of procedure Responsible person to drive you home and stay with you for 24 hrs Wash with special soap night before and morning of procedure If on anti-coagulant drug instructions Xarelto - last dose should have been 11/4.

## 2024-10-09 ENCOUNTER — Ambulatory Visit (HOSPITAL_COMMUNITY)
Admission: RE | Admit: 2024-10-09 | Discharge: 2024-10-09 | Disposition: A | Discharge status: Home / Self Care | Attending: Internal Medicine | Admitting: Internal Medicine

## 2024-10-09 ENCOUNTER — Other Ambulatory Visit: Payer: Self-pay

## 2024-10-09 ENCOUNTER — Encounter (HOSPITAL_COMMUNITY)
Admission: RE | Disposition: A | Discharge status: Home / Self Care | Payer: Self-pay | Source: Home / Self Care | Attending: Internal Medicine

## 2024-10-09 DIAGNOSIS — I10 Essential (primary) hypertension: Secondary | ICD-10-CM | POA: Insufficient documentation

## 2024-10-09 DIAGNOSIS — I442 Atrioventricular block, complete: Secondary | ICD-10-CM | POA: Insufficient documentation

## 2024-10-09 DIAGNOSIS — Z794 Long term (current) use of insulin: Secondary | ICD-10-CM | POA: Diagnosis not present

## 2024-10-09 DIAGNOSIS — Z79899 Other long term (current) drug therapy: Secondary | ICD-10-CM | POA: Insufficient documentation

## 2024-10-09 DIAGNOSIS — E119 Type 2 diabetes mellitus without complications: Secondary | ICD-10-CM | POA: Diagnosis not present

## 2024-10-09 DIAGNOSIS — Z45010 Encounter for checking and testing of cardiac pacemaker pulse generator [battery]: Secondary | ICD-10-CM | POA: Insufficient documentation

## 2024-10-09 DIAGNOSIS — Z7984 Long term (current) use of oral hypoglycemic drugs: Secondary | ICD-10-CM | POA: Diagnosis not present

## 2024-10-09 DIAGNOSIS — Z6841 Body Mass Index (BMI) 40.0 and over, adult: Secondary | ICD-10-CM | POA: Insufficient documentation

## 2024-10-09 HISTORY — PX: PPM GENERATOR CHANGEOUT: EP1233

## 2024-10-09 LAB — GLUCOSE, CAPILLARY
Glucose-Capillary: 179 mg/dL — ABNORMAL HIGH (ref 70–99)
Glucose-Capillary: 169 mg/dL — ABNORMAL HIGH (ref 70–99)

## 2024-10-09 SURGERY — PPM GENERATOR CHANGEOUT

## 2024-10-09 MED ORDER — ONDANSETRON HCL 4 MG/2ML IJ SOLN: 4.0000 mg | Freq: Four times a day (QID) | INTRAMUSCULAR | Status: DC | PRN

## 2024-10-09 MED ORDER — LIDOCAINE HCL (PF) 1 % IJ SOLN
INTRAMUSCULAR | Status: AC
  Filled 2024-10-09: qty 90

## 2024-10-09 MED ORDER — SODIUM CHLORIDE 0.9 % IV SOLN
INTRAVENOUS | Status: AC
  Administered 2024-10-09: 80 mg
  Filled 2024-10-09: qty 2

## 2024-10-09 MED ORDER — ACETAMINOPHEN 325 MG PO TABS: 325.0000 mg | ORAL_TABLET | ORAL | Status: DC | PRN

## 2024-10-09 MED ORDER — LIDOCAINE HCL (PF) 1 % IJ SOLN
INTRAMUSCULAR | Status: DC | PRN
  Administered 2024-10-09: 30 mL

## 2024-10-09 SURGICAL SUPPLY — 5 items
CABLE SURGICAL S-101-97-12 (CABLE) ×1 IMPLANT
ELECT DEFIB PAD ADLT CADENCE (PAD) IMPLANT
IPG PACE AZUR XT DR MRI W1DR01 (Pacemaker) IMPLANT
POUCH AIGIS-R ANTIBACT PPM MED (Mesh General) IMPLANT
TRAY PACEMAKER INSERTION (PACKS) ×1 IMPLANT

## 2024-10-09 NOTE — Discharge Instructions (Signed)

## 2024-10-09 NOTE — H&P (Signed)
 HPI [name] presents today for ongoing evaluation and management of his medtronic PPM. He is a pleasant 68 yo man with CHB, s/p PPM insertion, DM, morbid obesity, and HTN. He denies chest pressure or sob. No edema. He has been compliant with his meds.  He is approaching ERI. He feels well.  Allergies       Allergies  Allergen Reactions   Metformin Other (See Comments)      Upset stomach                Current Outpatient Medications  Medication Sig Dispense Refill   AMBULATORY NON FORMULARY MEDICATION 0.2 mLs by Intracavernosal route as needed. Medication Name: Trimix   PGE 40mcg Pap 30mg  Phent 1mg  5 mL 5   amLODipine -benazepril  (LOTREL) 5-40 MG capsule TAKE 1 CAPSULE DAILY       (10/04/20 AMLODIPINE         STOPPED) 90 capsule 3   Cholecalciferol (VITAMIN D) 50 MCG (2000 UT) CAPS Take 1 capsule by mouth daily.       Continuous Blood Gluc Sensor (FREESTYLE LIBRE 2 SENSOR) MISC Inject into the skin as directed. 10-12 times daily       finasteride  (PROSCAR ) 5 MG tablet Take 1 tablet (5 mg total) by mouth daily. 90 tablet 3   GVOKE HYPOPEN 2-PACK 1 MG/0.2ML SOAJ Inject into the skin.       Insulin Aspart FlexPen (NOVOLOG) 100 UNIT/ML Inject 50 Units into the skin in the morning, at noon, and at bedtime.       insulin lispro (HUMALOG) 100 UNIT/ML injection Inject into the skin 3 (three) times daily before meals.       JARDIANCE 25 MG TABS tablet Take 25 mg by mouth daily.       levothyroxine (SYNTHROID) 175 MCG tablet Take 175 mcg by mouth daily.       meloxicam  (MOBIC ) 7.5 MG tablet Take 1 tablet (7.5 mg total) by mouth daily. 30 tablet 0   mirabegron  ER (MYRBETRIQ ) 25 MG TB24 tablet Take 1 tablet (25 mg total) by mouth daily. 90 tablet 3   rosuvastatin (CRESTOR) 40 MG tablet Take 40 mg by mouth daily.       spironolactone  (ALDACTONE ) 25 MG tablet TAKE 1 TABLET DAILY 90 tablet 3   tiZANidine  (ZANAFLEX ) 4 MG tablet Take 1 tablet (4 mg total) by mouth every 6 (six) hours  as needed for muscle spasms. 30 tablet 0   TRESIBA FLEXTOUCH 200 UNIT/ML FlexTouch Pen Inject 75 Units into the skin 2 (two) times daily.       XARELTO  20 MG TABS tablet TAKE 1 TABLET DAILY 90 tablet 1      No current facility-administered medications for this visit.              Past Medical History:  Diagnosis Date   Diabetes mellitus without complication (HCC)     Heart disease     Sleep apnea            ROS:    All systems reviewed and negative except as noted in the HPI.          Past Surgical History:  Procedure Laterality Date   APPENDECTOMY       HERNIA REPAIR       PACEMAKER IMPLANT                     Family History  Problem Relation Age  of Onset   Leukemia Father     Stroke Mother              Social History         Socioeconomic History   Marital status: Married      Spouse name: Not on file   Number of children: 2   Years of education: Not on file   Highest education level: Not on file  Occupational History   Occupation: research scientist (medical)  Tobacco Use   Smoking status: Never      Passive exposure: Never   Smokeless tobacco: Never  Vaping Use   Vaping status: Never Used  Substance and Sexual Activity   Alcohol use: Not Currently   Drug use: Never   Sexual activity: Not on file  Other Topics Concern   Not on file  Social History Narrative   Not on file    Social Drivers of Health    Financial Resource Strain: Not on file  Food Insecurity: Not on file  Transportation Needs: Not on file  Physical Activity: Not on file  Stress: Not on file  Social Connections: Not on file  Intimate Partner Violence: Not on file        BP (!) 110/56 (BP Location: Left Arm, Patient Position: Sitting, Cuff Size: Large)   Pulse 82   Ht 5' 10 (1.778 m)   Wt (!) 330 lb (149.7 kg)   SpO2 94%   BMI 47.35 kg/m    Physical Exam:   Well appearing NAD HEENT: Unremarkable Neck:  No JVD, no thyromegally Lymphatics:  No adenopathy Back:  No CVA  tenderness Lungs:  Clear HEART:  Regular rate rhythm, no murmurs, no rubs, no clicks Abd:  soft, positive bowel sounds, no organomegally, no rebound, no guarding Ext:  2 plus pulses, no edema, no cyanosis, no clubbing Skin:  No rashes no nodules Neuro:  CN II through XII intact, motor grossly intact   DEVICE  Normal device function.  See PaceArt for details. Approaching ERI.    Assess/Plan:   CHB - he is asymptomatic s/p PPM insertion. He has no escape today. Obesity - he is encouraged to lose weight. HTN - his bp is well controlled.  PPM  - his medtronic DDD PM is 1-2 months from ERI. We will follow.   Danelle Waddell COME  EP Attending  In the interim the patient has reached ERI and will undergo PPM gen change for CHB. I have reviewed the indications/risks/benefits/goals/expectations and he wishes to proceed.  Danelle Raesean Bartoletti,MD

## 2024-10-12 ENCOUNTER — Encounter: Payer: Self-pay | Admitting: Emergency Medicine

## 2024-10-12 ENCOUNTER — Encounter (HOSPITAL_COMMUNITY): Payer: Self-pay | Admitting: Internal Medicine

## 2024-10-14 ENCOUNTER — Telehealth: Payer: Self-pay

## 2024-10-14 NOTE — Telephone Encounter (Signed)
 Follow-up after same day discharge: Implant date: 10/09/2024 MD: Danelle Birmingham, MD Device: PPM gen change  Location: L chest    Wound check visit: 10/21/2024 90 day MD follow-up: 01/15/2025  Remote Transmission received:yes   Dressing/sling removed: n/a  Confirm OAC restart on: yes   Please continue to monitor your cardiac device site for redness, swelling, and drainage. Call the device clinic at [phone] if you experience these symptoms, fever/chills, or have questions about your device.   Remote monitoring is used to monitor your cardiac device from home. This monitoring is scheduled every 91 days by our office. It allows us  to keep an eye on the functioning of your device to ensure it is working properly.

## 2024-10-21 ENCOUNTER — Ambulatory Visit: Attending: Cardiovascular Disease

## 2024-10-21 DIAGNOSIS — I442 Atrioventricular block, complete: Secondary | ICD-10-CM | POA: Diagnosis not present

## 2024-10-21 LAB — CUP PACEART INCLINIC DEVICE CHECK
Battery Remaining Longevity: 147 mo
Battery Voltage: 3.22 V
Brady Statistic AP VP Percent: 25.92 %
Brady Statistic AP VS Percent: 0 %
Brady Statistic AS VP Percent: 74.05 %
Brady Statistic AS VS Percent: 0.03 %
Brady Statistic RA Percent Paced: 25.91 %
Brady Statistic RV Percent Paced: 99.97 %
Date Time Interrogation Session: 20251119103907
Implantable Lead Connection Status: 753985
Implantable Lead Connection Status: 753985
Implantable Lead Implant Date: 20160525
Implantable Lead Implant Date: 20160525
Implantable Lead Location: 753859
Implantable Lead Location: 753860
Implantable Lead Model: 5076
Implantable Lead Model: 5076
Implantable Pulse Generator Implant Date: 20251107
Lead Channel Impedance Value: 342 Ohm
Lead Channel Impedance Value: 361 Ohm
Lead Channel Impedance Value: 418 Ohm
Lead Channel Impedance Value: 437 Ohm
Lead Channel Pacing Threshold Amplitude: 0.625 V
Lead Channel Pacing Threshold Amplitude: 0.625 V
Lead Channel Pacing Threshold Pulse Width: 0.4 ms
Lead Channel Pacing Threshold Pulse Width: 0.4 ms
Lead Channel Sensing Intrinsic Amplitude: 1.75 mV
Lead Channel Sensing Intrinsic Amplitude: 2.375 mV
Lead Channel Setting Pacing Amplitude: 1.5 V
Lead Channel Setting Pacing Amplitude: 2 V
Lead Channel Setting Pacing Pulse Width: 0.4 ms
Lead Channel Setting Sensing Sensitivity: 0.9 mV
Zone Setting Status: 755011
Zone Setting Status: 755011

## 2024-10-21 NOTE — Progress Notes (Signed)
 Normal dual chamber pacemaker wound check. Presenting rhythm: AP/VP 60. Wound well healed. Routine testing of thresholds, sensing, and impedance demonstrate stable parameters and no programming changes needed at this time. No episodes. Estimated longevity 12+ years. Pt enrolled in remote follow-up.

## 2024-10-21 NOTE — Patient Instructions (Signed)

## 2024-10-25 ENCOUNTER — Ambulatory Visit

## 2024-10-26 ENCOUNTER — Encounter

## 2024-11-06 ENCOUNTER — Other Ambulatory Visit: Payer: Medicare Other

## 2024-11-06 DIAGNOSIS — N138 Other obstructive and reflux uropathy: Secondary | ICD-10-CM

## 2024-11-13 ENCOUNTER — Ambulatory Visit (INDEPENDENT_AMBULATORY_CARE_PROVIDER_SITE_OTHER): Payer: Medicare Other | Admitting: Urology

## 2024-11-13 VITALS — BP 147/69 | HR 89

## 2024-11-13 DIAGNOSIS — R351 Nocturia: Secondary | ICD-10-CM | POA: Diagnosis not present

## 2024-11-13 DIAGNOSIS — N5201 Erectile dysfunction due to arterial insufficiency: Secondary | ICD-10-CM

## 2024-11-13 DIAGNOSIS — N138 Other obstructive and reflux uropathy: Secondary | ICD-10-CM

## 2024-11-13 DIAGNOSIS — N3281 Overactive bladder: Secondary | ICD-10-CM

## 2024-11-13 DIAGNOSIS — N401 Enlarged prostate with lower urinary tract symptoms: Secondary | ICD-10-CM | POA: Diagnosis not present

## 2024-11-13 MED ORDER — AMBULATORY NON FORMULARY MEDICATION: 0.2000 mL | 5 refills | Status: AC | PRN

## 2024-11-13 MED ORDER — MIRABEGRON ER 25 MG PO TB24: 25.0000 mg | ORAL_TABLET | Freq: Every day | ORAL | 3 refills | Status: AC

## 2024-11-13 MED ORDER — FINASTERIDE 5 MG PO TABS: 5.0000 mg | ORAL_TABLET | Freq: Every day | ORAL | 3 refills | Status: AC

## 2024-11-13 NOTE — Progress Notes (Unsigned)
 11/13/2024 9:08 AM   Jeffery Wright 02-06-1956 [id number]  Referring provider: Bertell Satterfield, MD [address] [place],  [place] [address]  Followup BPH and erectile dysfunction   HPI: No recent PSA. Trimix works well. IPSS 2 QOl 2 on finasteride  5mg  and mirabegron  25mg  daily   PMH: Past Medical History:  Diagnosis Date   Diabetes mellitus without complication (HCC)    Heart disease    HTN (hypertension) 09/03/2024   S/P placement of cardiac pacemaker 09/03/2024   Sleep apnea     Surgical History: Past Surgical History:  Procedure Laterality Date   APPENDECTOMY     HERNIA REPAIR     PACEMAKER IMPLANT     PPM GENERATOR CHANGEOUT N/A 10/09/2024   Procedure: PPM GENERATOR CHANGEOUT;  Surgeon: Waddell Danelle ORN, MD;  Location: [facility];  Service: Cardiovascular;  Laterality: N/A;    Home Medications:  Allergies as of 11/13/2024       Reactions   Metformin Other (See Comments)   Upset stomach        Medication List        Accurate as of November 13, 2024  9:08 AM. If you have any questions, ask your nurse or doctor.          AMBULATORY NON FORMULARY MEDICATION 0.2 mLs by Intracavernosal route as needed. Medication Name: Trimix  PGE 40mcg Pap 30mg  Phent 1mg    amLODipine -benazepril  5-40 MG capsule Commonly known as: LOTREL TAKE 1 CAPSULE DAILY       (10/04/20 AMLODIPINE         STOPPED)   finasteride  5 MG tablet Commonly known as: PROSCAR  Take 1 tablet (5 mg total) by mouth daily.   FreeStyle Libre 2 Sensor Misc Inject into the skin as directed. 10-12 times daily   Gvoke HypoPen 2-Pack 1 MG/0.2ML Soaj Generic drug: Glucagon Inject into the skin.   Insulin Aspart FlexPen 100 UNIT/ML Commonly known as: NOVOLOG Inject 50 Units into the skin in the morning, at noon, and at bedtime.   Jardiance 25 MG Tabs tablet Generic drug: empagliflozin Take 25 mg by mouth daily.   levothyroxine 175 MCG tablet Commonly known as:  SYNTHROID Take 175 mcg by mouth daily.   meloxicam  7.5 MG tablet Commonly known as: MOBIC  Take 1 tablet (7.5 mg total) by mouth daily.   mirabegron  ER 25 MG Tb24 tablet Commonly known as: Myrbetriq  Take 1 tablet (25 mg total) by mouth daily.   rosuvastatin 40 MG tablet Commonly known as: CRESTOR Take 40 mg by mouth daily.   spironolactone  25 MG tablet Commonly known as: ALDACTONE  TAKE 1 TABLET DAILY   tiZANidine  4 MG tablet Commonly known as: Zanaflex  Take 1 tablet (4 mg total) by mouth every 6 (six) hours as needed for muscle spasms.   Tresiba FlexTouch 200 UNIT/ML FlexTouch Pen Generic drug: insulin degludec Inject 75 Units into the skin 2 (two) times daily.   Vitamin D 50 MCG (2000 UT) Caps Take 2,000 Units by mouth daily.   Xarelto  20 MG Tabs tablet Generic drug: rivaroxaban  TAKE 1 TABLET DAILY        Allergies: Allergies[1]  Family History: Family History  Problem Relation Age of Onset   Leukemia Father    Stroke Mother     Social History:  reports that he has never smoked. He has never been exposed to tobacco smoke. He has never used smokeless tobacco. He reports that he does not currently use alcohol. He reports that he does  not use drugs.  ROS: All other review of systems were reviewed and are negative except what is noted above in HPI  Physical Exam: BP (!) 147/69   Pulse 89   Constitutional:  Alert and oriented, No acute distress. HEENT: [place] AT, moist mucus membranes.  Trachea midline, no masses. Cardiovascular: No clubbing, cyanosis, or edema. Respiratory: Normal respiratory effort, no increased work of breathing. GI: Abdomen is soft, nontender, nondistended, no abdominal masses GU: No CVA tenderness.  Lymph: No cervical or inguinal lymphadenopathy. Skin: No rashes, bruises or suspicious lesions. Neurologic: Grossly intact, no focal deficits, moving all 4 extremities. Psychiatric: Normal mood and affect.  Laboratory Data: Lab Results   Component Value Date   WBC 7.8 10/02/2024   HGB 14.5 10/02/2024   HCT 42.3 10/02/2024   MCV 99 (H) 10/02/2024   PLT 233 10/02/2024    Lab Results  Component Value Date   CREATININE 0.90 10/02/2024    No results found for: PSA  No results found for: TESTOSTERONE  No results found for: HGBA1C  Urinalysis    Component Value Date/Time   APPEARANCEUR Clear 11/08/2023 1055   GLUCOSEU 3+ (A) 11/08/2023 1055   BILIRUBINUR Negative 11/08/2023 1055   PROTEINUR Negative 11/08/2023 1055   UROBILINOGEN negative (A) 03/30/2020 1151   NITRITE Negative 11/08/2023 1055   LEUKOCYTESUR Negative 11/08/2023 1055    Lab Results  Component Value Date   LABMICR Comment 11/08/2023    Pertinent Imaging: *** No results found for this or any previous visit.  No results found for this or any previous visit.  No results found for this or any previous visit.  No results found for this or any previous visit.  No results found for this or any previous visit.  No results found for this or any previous visit.  No results found for this or any previous visit.  No results found for this or any previous visit.   Assessment & Plan:    1. Benign prostatic hyperplasia with urinary obstruction (Primary) Continue finasteride  5mg    2. Erectile dysfunction due to arterial insufficiency Trimix prn  3. OAB (overactive bladder) Continue mirabegron  25mg  daily  4. Nocturia Continue finasteride  5mg    No follow-ups on file.  Belvie Clara, MD  [facility] [place]      [1]  Allergies Allergen Reactions   Metformin Other (See Comments)    Upset stomach

## 2024-11-14 LAB — PSA: Prostate Specific Ag, Serum: <0.1 ng/mL (ref 0.0–4.0)

## 2024-11-24 ENCOUNTER — Encounter: Payer: Self-pay | Admitting: Urology

## 2024-11-24 NOTE — Patient Instructions (Signed)

## 2024-11-25 ENCOUNTER — Ambulatory Visit

## 2024-12-07 ENCOUNTER — Ambulatory Visit: Payer: Self-pay | Admitting: Urology

## 2024-12-14 ENCOUNTER — Other Ambulatory Visit: Payer: Self-pay | Admitting: Cardiology

## 2024-12-24 ENCOUNTER — Ambulatory Visit

## 2024-12-24 DIAGNOSIS — I442 Atrioventricular block, complete: Secondary | ICD-10-CM | POA: Diagnosis not present

## 2024-12-25 LAB — CUP PACEART REMOTE DEVICE CHECK
Battery Remaining Longevity: 143 mo
Battery Voltage: 3.21 V
Brady Statistic AP VP Percent: 28.19 %
Brady Statistic AP VS Percent: 0 %
Brady Statistic AS VP Percent: 71.75 %
Brady Statistic AS VS Percent: 0.06 %
Brady Statistic RA Percent Paced: 28.19 %
Brady Statistic RV Percent Paced: 99.94 %
Date Time Interrogation Session: 20260121233610
Implantable Lead Connection Status: 753985
Implantable Lead Connection Status: 753985
Implantable Lead Implant Date: 20160525
Implantable Lead Implant Date: 20160525
Implantable Lead Location: 753859
Implantable Lead Location: 753860
Implantable Lead Model: 5076
Implantable Lead Model: 5076
Implantable Pulse Generator Implant Date: 20251107
Lead Channel Impedance Value: 342 Ohm
Lead Channel Impedance Value: 361 Ohm
Lead Channel Impedance Value: 418 Ohm
Lead Channel Impedance Value: 418 Ohm
Lead Channel Pacing Threshold Amplitude: 0.625 V
Lead Channel Pacing Threshold Amplitude: 0.75 V
Lead Channel Pacing Threshold Pulse Width: 0.4 ms
Lead Channel Pacing Threshold Pulse Width: 0.4 ms
Lead Channel Sensing Intrinsic Amplitude: 2.375 mV
Lead Channel Sensing Intrinsic Amplitude: 2.375 mV
Lead Channel Setting Pacing Amplitude: 1.5 V
Lead Channel Setting Pacing Amplitude: 2 V
Lead Channel Setting Pacing Pulse Width: 0.4 ms
Lead Channel Setting Sensing Sensitivity: 0.9 mV
Zone Setting Status: 755011
Zone Setting Status: 755011

## 2024-12-26 ENCOUNTER — Ambulatory Visit

## 2024-12-26 NOTE — Progress Notes (Signed)
 Remote PPM Transmission

## 2024-12-28 ENCOUNTER — Other Ambulatory Visit: Payer: Self-pay | Admitting: Cardiology

## 2025-01-03 ENCOUNTER — Ambulatory Visit: Payer: Self-pay | Admitting: Student in an Organized Health Care Education/Training Program

## 2025-01-11 ENCOUNTER — Ambulatory Visit: Admitting: Pulmonary Disease

## 2025-01-15 ENCOUNTER — Ambulatory Visit: Admitting: Nurse Practitioner

## 2025-01-15 ENCOUNTER — Ambulatory Visit: Admitting: Student in an Organized Health Care Education/Training Program

## 2025-01-22 ENCOUNTER — Ambulatory Visit: Admitting: Nurse Practitioner

## 2025-01-26 ENCOUNTER — Ambulatory Visit

## 2025-02-03 ENCOUNTER — Ambulatory Visit

## 2025-03-25 ENCOUNTER — Ambulatory Visit

## 2025-06-24 ENCOUNTER — Ambulatory Visit

## 2025-09-23 ENCOUNTER — Ambulatory Visit

## 2025-11-02 ENCOUNTER — Other Ambulatory Visit

## 2025-11-10 ENCOUNTER — Ambulatory Visit: Admitting: Urology

## 2025-12-23 ENCOUNTER — Ambulatory Visit
